# Patient Record
Sex: Male | Born: 1935 | Race: White | Hispanic: No | Marital: Married | Smoking: Former smoker
Health system: Southern US, Community
[De-identification: ages and names within clinical notes are randomized; demographics above are authoritative.]

## PROBLEM LIST (undated history)

## (undated) DIAGNOSIS — Z9181 History of falling: Secondary | ICD-10-CM

## (undated) DIAGNOSIS — E119 Type 2 diabetes mellitus without complications: Secondary | ICD-10-CM

## (undated) DIAGNOSIS — F32A Depression, unspecified: Secondary | ICD-10-CM

## (undated) DIAGNOSIS — F329 Major depressive disorder, single episode, unspecified: Secondary | ICD-10-CM

## (undated) DIAGNOSIS — I480 Paroxysmal atrial fibrillation: Secondary | ICD-10-CM

## (undated) DIAGNOSIS — E559 Vitamin D deficiency, unspecified: Secondary | ICD-10-CM

## (undated) DIAGNOSIS — K219 Gastro-esophageal reflux disease without esophagitis: Secondary | ICD-10-CM

## (undated) DIAGNOSIS — E43 Unspecified severe protein-calorie malnutrition: Secondary | ICD-10-CM

## (undated) DIAGNOSIS — N400 Enlarged prostate without lower urinary tract symptoms: Secondary | ICD-10-CM

## (undated) DIAGNOSIS — Z789 Other specified health status: Secondary | ICD-10-CM

## (undated) DIAGNOSIS — I5022 Chronic systolic (congestive) heart failure: Secondary | ICD-10-CM

## (undated) DIAGNOSIS — I350 Nonrheumatic aortic (valve) stenosis: Secondary | ICD-10-CM

## (undated) DIAGNOSIS — I2510 Atherosclerotic heart disease of native coronary artery without angina pectoris: Secondary | ICD-10-CM

## (undated) DIAGNOSIS — M1990 Unspecified osteoarthritis, unspecified site: Secondary | ICD-10-CM

## (undated) DIAGNOSIS — I10 Essential (primary) hypertension: Secondary | ICD-10-CM

## (undated) DIAGNOSIS — E1121 Type 2 diabetes mellitus with diabetic nephropathy: Secondary | ICD-10-CM

## (undated) DIAGNOSIS — Z9289 Personal history of other medical treatment: Secondary | ICD-10-CM

## (undated) DIAGNOSIS — D509 Iron deficiency anemia, unspecified: Secondary | ICD-10-CM

## (undated) HISTORY — DX: Unspecified severe protein-calorie malnutrition: E43

## (undated) HISTORY — PX: ADENOIDECTOMY, TONSILLECTOMY AND MYRINGOTOMY WITH TUBE PLACEMENT: SHX5716

## (undated) HISTORY — PX: COLONOSCOPY: SHX174

## (undated) HISTORY — PX: OTHER SURGICAL HISTORY: SHX169

---

## 2000-03-08 HISTORY — PX: CORONARY ARTERY BYPASS GRAFT: SHX141

## 2015-04-07 ENCOUNTER — Inpatient Hospital Stay (HOSPITAL_COMMUNITY)
Admission: AD | Admit: 2015-04-07 | Discharge: 2015-04-12 | DRG: 313 | Disposition: A | Discharge status: Home / Self Care | Payer: Medicare (Managed Care) | Source: Other Acute Inpatient Hospital | Attending: Cardiovascular Disease | Admitting: Cardiovascular Disease

## 2015-04-07 ENCOUNTER — Encounter (HOSPITAL_COMMUNITY): Payer: Self-pay | Admitting: Cardiology

## 2015-04-07 DIAGNOSIS — Z87891 Personal history of nicotine dependence: Secondary | ICD-10-CM

## 2015-04-07 DIAGNOSIS — K7460 Unspecified cirrhosis of liver: Secondary | ICD-10-CM | POA: Diagnosis present

## 2015-04-07 DIAGNOSIS — N400 Enlarged prostate without lower urinary tract symptoms: Secondary | ICD-10-CM | POA: Diagnosis present

## 2015-04-07 DIAGNOSIS — I420 Dilated cardiomyopathy: Secondary | ICD-10-CM | POA: Insufficient documentation

## 2015-04-07 DIAGNOSIS — I2510 Atherosclerotic heart disease of native coronary artery without angina pectoris: Secondary | ICD-10-CM | POA: Diagnosis present

## 2015-04-07 DIAGNOSIS — R748 Abnormal levels of other serum enzymes: Secondary | ICD-10-CM | POA: Diagnosis present

## 2015-04-07 DIAGNOSIS — E43 Unspecified severe protein-calorie malnutrition: Secondary | ICD-10-CM | POA: Diagnosis present

## 2015-04-07 DIAGNOSIS — R7989 Other specified abnormal findings of blood chemistry: Secondary | ICD-10-CM

## 2015-04-07 DIAGNOSIS — D509 Iron deficiency anemia, unspecified: Secondary | ICD-10-CM | POA: Diagnosis present

## 2015-04-07 DIAGNOSIS — K219 Gastro-esophageal reflux disease without esophagitis: Secondary | ICD-10-CM | POA: Diagnosis present

## 2015-04-07 DIAGNOSIS — Z7982 Long term (current) use of aspirin: Secondary | ICD-10-CM

## 2015-04-07 DIAGNOSIS — R0602 Shortness of breath: Secondary | ICD-10-CM | POA: Diagnosis present

## 2015-04-07 DIAGNOSIS — I350 Nonrheumatic aortic (valve) stenosis: Secondary | ICD-10-CM | POA: Diagnosis not present

## 2015-04-07 DIAGNOSIS — Z794 Long term (current) use of insulin: Secondary | ICD-10-CM

## 2015-04-07 DIAGNOSIS — I5022 Chronic systolic (congestive) heart failure: Secondary | ICD-10-CM | POA: Diagnosis present

## 2015-04-07 DIAGNOSIS — Z6820 Body mass index (BMI) 20.0-20.9, adult: Secondary | ICD-10-CM | POA: Diagnosis not present

## 2015-04-07 DIAGNOSIS — I10 Essential (primary) hypertension: Secondary | ICD-10-CM | POA: Diagnosis present

## 2015-04-07 DIAGNOSIS — Z9181 History of falling: Secondary | ICD-10-CM | POA: Diagnosis not present

## 2015-04-07 DIAGNOSIS — R0789 Other chest pain: Secondary | ICD-10-CM | POA: Diagnosis present

## 2015-04-07 DIAGNOSIS — I25119 Atherosclerotic heart disease of native coronary artery with unspecified angina pectoris: Secondary | ICD-10-CM

## 2015-04-07 DIAGNOSIS — Z951 Presence of aortocoronary bypass graft: Secondary | ICD-10-CM | POA: Diagnosis not present

## 2015-04-07 DIAGNOSIS — Z880 Allergy status to penicillin: Secondary | ICD-10-CM

## 2015-04-07 DIAGNOSIS — R079 Chest pain, unspecified: Secondary | ICD-10-CM | POA: Diagnosis not present

## 2015-04-07 DIAGNOSIS — E1121 Type 2 diabetes mellitus with diabetic nephropathy: Secondary | ICD-10-CM | POA: Diagnosis present

## 2015-04-07 DIAGNOSIS — D649 Anemia, unspecified: Secondary | ICD-10-CM | POA: Diagnosis present

## 2015-04-07 DIAGNOSIS — I447 Left bundle-branch block, unspecified: Secondary | ICD-10-CM | POA: Diagnosis present

## 2015-04-07 DIAGNOSIS — I480 Paroxysmal atrial fibrillation: Secondary | ICD-10-CM | POA: Diagnosis present

## 2015-04-07 DIAGNOSIS — R778 Other specified abnormalities of plasma proteins: Secondary | ICD-10-CM

## 2015-04-07 DIAGNOSIS — E785 Hyperlipidemia, unspecified: Secondary | ICD-10-CM | POA: Diagnosis present

## 2015-04-07 DIAGNOSIS — E119 Type 2 diabetes mellitus without complications: Secondary | ICD-10-CM

## 2015-04-07 HISTORY — DX: Major depressive disorder, single episode, unspecified: F32.9

## 2015-04-07 HISTORY — DX: Gastro-esophageal reflux disease without esophagitis: K21.9

## 2015-04-07 HISTORY — DX: Essential (primary) hypertension: I10

## 2015-04-07 HISTORY — DX: Paroxysmal atrial fibrillation: I48.0

## 2015-04-07 HISTORY — DX: Type 2 diabetes mellitus with diabetic nephropathy: E11.21

## 2015-04-07 HISTORY — DX: Iron deficiency anemia, unspecified: D50.9

## 2015-04-07 HISTORY — DX: History of falling: Z91.81

## 2015-04-07 HISTORY — DX: Type 2 diabetes mellitus without complications: E11.9

## 2015-04-07 HISTORY — DX: Atherosclerotic heart disease of native coronary artery without angina pectoris: I25.10

## 2015-04-07 HISTORY — DX: Vitamin D deficiency, unspecified: E55.9

## 2015-04-07 HISTORY — DX: Nonrheumatic aortic (valve) stenosis: I35.0

## 2015-04-07 HISTORY — DX: Benign prostatic hyperplasia without lower urinary tract symptoms: N40.0

## 2015-04-07 HISTORY — DX: Other specified health status: Z78.9

## 2015-04-07 HISTORY — DX: Depression, unspecified: F32.A

## 2015-04-07 HISTORY — DX: Chronic systolic (congestive) heart failure: I50.22

## 2015-04-07 LAB — TROPONIN I: Troponin I: 0.06 ng/mL — ABNORMAL HIGH (ref <0.031)

## 2015-04-07 LAB — GLUCOSE, CAPILLARY: GLUCOSE-CAPILLARY: 74 mg/dL (ref 65–99)

## 2015-04-07 LAB — HEPARIN LEVEL (UNFRACTIONATED): HEPARIN UNFRACTIONATED: 0.72 [IU]/mL — AB (ref 0.30–0.70)

## 2015-04-07 MED ORDER — HEPARIN (PORCINE) IN NACL 100-0.45 UNIT/ML-% IJ SOLN
1000.0000 [IU]/h | INTRAMUSCULAR | Status: DC
  Administered 2015-04-07: 1200 [IU]/h via INTRAVENOUS
  Administered 2015-04-08: 1000 [IU]/h via INTRAVENOUS
  Administered 2015-04-10 – 2015-04-11 (×2): 900 [IU]/h via INTRAVENOUS
  Administered 2015-04-12: 1000 [IU]/h via INTRAVENOUS
  Filled 2015-04-07 (×4): qty 250

## 2015-04-07 MED ORDER — INSULIN ASPART 100 UNIT/ML ~~LOC~~ SOLN
0.0000 [IU] | Freq: Three times a day (TID) | SUBCUTANEOUS | Status: DC
  Administered 2015-04-08: 8 [IU] via SUBCUTANEOUS
  Administered 2015-04-08 – 2015-04-09 (×2): 2 [IU] via SUBCUTANEOUS
  Administered 2015-04-09: 5 [IU] via SUBCUTANEOUS
  Administered 2015-04-10: 3 [IU] via SUBCUTANEOUS
  Administered 2015-04-10: 11 [IU] via SUBCUTANEOUS
  Administered 2015-04-10: 3 [IU] via SUBCUTANEOUS
  Administered 2015-04-11: 8 [IU] via SUBCUTANEOUS
  Administered 2015-04-11: 3 [IU] via SUBCUTANEOUS
  Administered 2015-04-12: 5 [IU] via SUBCUTANEOUS
  Administered 2015-04-12: 3 [IU] via SUBCUTANEOUS
  Administered 2015-04-12: 2 [IU] via SUBCUTANEOUS

## 2015-04-07 MED ORDER — ASPIRIN EC 81 MG PO TBEC
81.0000 mg | DELAYED_RELEASE_TABLET | Freq: Every day | ORAL | Status: DC
  Administered 2015-04-08 – 2015-04-12 (×5): 81 mg via ORAL
  Filled 2015-04-07 (×5): qty 1

## 2015-04-07 MED ORDER — ONDANSETRON HCL 4 MG/2ML IJ SOLN: 4.0000 mg | Freq: Four times a day (QID) | INTRAMUSCULAR | Status: DC | PRN

## 2015-04-07 MED ORDER — ATORVASTATIN CALCIUM 40 MG PO TABS
40.0000 mg | ORAL_TABLET | Freq: Every day | ORAL | Status: DC
  Administered 2015-04-07 – 2015-04-12 (×6): 40 mg via ORAL
  Filled 2015-04-07 (×6): qty 1

## 2015-04-07 MED ORDER — SOTALOL HCL 120 MG PO TABS
120.0000 mg | ORAL_TABLET | Freq: Every day | ORAL | Status: DC
  Administered 2015-04-08 – 2015-04-12 (×5): 120 mg via ORAL
  Filled 2015-04-07 (×5): qty 1

## 2015-04-07 MED ORDER — HYDRALAZINE HCL 20 MG/ML IJ SOLN
5.0000 mg | Freq: Three times a day (TID) | INTRAMUSCULAR | Status: DC | PRN
  Administered 2015-04-07: 5 mg via INTRAVENOUS
  Filled 2015-04-07: qty 1

## 2015-04-07 MED ORDER — DULOXETINE HCL 60 MG PO CPEP
60.0000 mg | ORAL_CAPSULE | Freq: Every day | ORAL | Status: DC
  Administered 2015-04-07 – 2015-04-12 (×6): 60 mg via ORAL
  Filled 2015-04-07 (×6): qty 1

## 2015-04-07 MED ORDER — FUROSEMIDE 20 MG PO TABS
20.0000 mg | ORAL_TABLET | Freq: Every day | ORAL | Status: DC
  Administered 2015-04-08 – 2015-04-12 (×5): 20 mg via ORAL
  Filled 2015-04-07 (×5): qty 1

## 2015-04-07 MED ORDER — PANTOPRAZOLE SODIUM 40 MG PO TBEC
40.0000 mg | DELAYED_RELEASE_TABLET | Freq: Every day | ORAL | Status: DC
  Administered 2015-04-08 – 2015-04-12 (×5): 40 mg via ORAL
  Filled 2015-04-07 (×5): qty 1

## 2015-04-07 MED ORDER — ACETAMINOPHEN 325 MG PO TABS: 650.0000 mg | ORAL_TABLET | ORAL | Status: DC | PRN

## 2015-04-07 MED ORDER — NITROGLYCERIN 0.4 MG SL SUBL
0.4000 mg | SUBLINGUAL_TABLET | SUBLINGUAL | Status: DC | PRN
  Administered 2015-04-08: 0.4 mg via SUBLINGUAL
  Filled 2015-04-07: qty 1

## 2015-04-07 NOTE — Progress Notes (Signed)
Pt arrived from [facility] at 1950.  EMS stated they had repeated EKG and there were ST elevations.  Patient in no acute distress.  Denies CP currently and en route.  Wife at bedside.  VSS.  Cardiology PA and on Call MD at bedside.  IV Heparin infusing at 63ml/hr.

## 2015-04-07 NOTE — Progress Notes (Addendum)
ANTICOAGULATION CONSULT NOTE - Initial Consult  Pharmacy Consult for heparin Indication: chest pain/ACS  No Known Allergies  Patient Measurements: Height:  (185.4 cm) Weight: 149 lb 6.4 oz (67.767 kg) IBW/kg (Calculated) : 79.9 Heparin Dosing Weight: 67.7 kg  Vital Signs: BP: 173/108 mmHg (01/30 2021) Pulse Rate: 90 (01/30 2021)  Labs: No results for input(s): HGB, HCT, PLT, APTT, LABPROT, INR, HEPARINUNFRC, HEPRLOWMOCWT, CREATININE, CKTOTAL, CKMB, TROPONINI in the last 72 hours.  CrCl cannot be calculated (Patient has no serum creatinine result on file.).   Medical History: Past Medical History  Diagnosis Date  . CAD (coronary artery disease)     Multivessel status post CABG 2002  . Essential hypertension   . Depression   . Type 2 diabetes mellitus (HCC)   . PAF (paroxysmal atrial fibrillation) (HCC)     On Sotalol (previously Eliquis - stopped 02/2015)  . Aortic stenosis     Moderate to severe May 2016  . Vitamin D deficiency   . Diabetic nephropathy (HCC)   . BPH (benign prostatic hyperplasia)   . GERD (gastroesophageal reflux disease)   . Chronic systolic heart failure (HCC)     LVEF 45-50% May 2016  . Medication intolerance     Amiodarone - nausea and emesis  . History of fall     Right wrist fracture and facial contusion 02/2015, possibly associated with orthostasis  . Iron deficiency anemia     Assessment: 81 YOM transferred from [facility] for chest pain. IV heparin 1200 units/hr was started prior to transfer at around 1600. He also received 4100 units bolus. Hgb 13.1, plt 232K, he has hx of afib, but not on anticoagulation prior to this admission, INR =1. Current heparin rate seems a little high. Will check a level now and adjust.   Goal of Therapy:  Heparin level 0.3-0.7 units/ml Monitor platelets by anticoagulation protocol: Yes   Plan:  - Continue heparin infusion 1200 units/hr for now.  - Check heparin level now - Daily heparin level  and CBC   Bayard Hugger, PharmD, BCPS  Clinical Pharmacist  Pager: [phone]   04/07/2015,9:01 PM    11:09 PM - HL = 0.72, will decrease to 1100 units / hr and follow up in AM Thank you Okey Regal, PharmD [phone]

## 2015-04-07 NOTE — H&P (Signed)
Patient ID: Gary Beck MRN: [id number], DOB/AGE: 1929-02-21   Admit date: 04/07/2015   Primary Physician: [name], MD Primary Cardiologist: Dr. Marisue Brooklyn - [facility]   NWG:[id number] Gary Beck is a 82 y.o. male with past medical history of CAD (s/p 3-vessel CABG in 2002), HTN, paroxysmal atrial fibrillation (not on anticoagulation since 02/2015), HLD, chronic systolic CHF (EF 08-65% by echo in May 2016), and moderate to severe aortic stenosis who presents to [facility] on 04/07/2015 as a transfer from [facility] for chest pain.   The patient reports his chest pain began earlier this afternoon following a trip to see his PCP in [place], [place]. He describes the pain as an "electrial shooting sensation" in his left pectoral region lasting several minutes. He denies any associated radiating pain, dyspnea on exertion, nausea, vomiting, or diaphoresis. He is currently without chest pain.  The patient was seen at [facility] for his chest pain. His initial EKG showed NSR, HR 64, old LBBB and left axis deviation. Troponin was slightly elevated at 0.036. INR 1.00. Creatinine stable at 0.90. K+ 3.7.WBC 7.2. Hgb 13.1. Platelets at 232. He was started on IV Heparin while at [facility] and transferred to [facility] for further evaluation.   He remains without chest pain at this time. His wife reports he had a recent cardiac catheterization in 02/2015 as work-up for possible aortic valve replacement. She says "all of his grafts were patent and everything looked well then". He has been on Eliquis for anticoagulation but they are unsure as to why this was stopped except that he has been off since the time of the cardiac cathetertization. In reviewing records, he had a fall in December and his Hgb was approximately 6.0 at that time, but he was already off Eliquis at that time. He denies any active bleeding at this time or previously when he was severely anemic. He had also been on Amiodarone in the  past for his atrial fibrillation but was unable to tolerate the medication due to severe nausea, vomiting, and weight loss. He was restarted on Sotalol in December.   Home Medications:  Prior to Admission medications   Not on File    Allergies No Known Allergies  Past Medical History Past Medical History  Diagnosis Date  . CAD (coronary artery disease)     Multivessel status post CABG 2002  . Essential hypertension   . Depression   . Type 2 diabetes mellitus (HCC)   . PAF (paroxysmal atrial fibrillation) (HCC)     On Sotalol (previously Eliquis - stopped 02/2015)  . Aortic stenosis     Moderate to severe May 2016  . Vitamin D deficiency   . Diabetic nephropathy (HCC)   . BPH (benign prostatic hyperplasia)   . GERD (gastroesophageal reflux disease)   . Chronic systolic heart failure (HCC)     LVEF 45-50% May 2016  . Medication intolerance     Amiodarone - nausea and emesis  . History of fall     Right wrist fracture and facial contusion 02/2015, possibly associated with orthostasis  . Iron deficiency anemia      Surgical History   Past Surgical History  Procedure Laterality Date  . Cataract surgery Bilateral   . Coronary artery bypass graft  2002     [facility]  . Adenoidectomy, tonsillectomy and myringotomy with tube placement       Family History  Family History  Problem Relation Age of Onset  . Heart  attack Father   . Hypertension Father   . Diabetes Mellitus II Brother     Social History  Social History   Social History  . Marital Status: Married    Spouse Name: N/A  . Number of Children: N/A  . Years of Education: N/A   Occupational History  . Not on file.   Social History Main Topics  . Smoking status: Former Smoker    Types: Cigarettes    Quit date: 03/09/1959  . Smokeless tobacco: Not on file  . Alcohol Use: No  . Drug Use: No  . Sexual Activity: Not on file   Other Topics Concern  . Not on file   Social History  Narrative     Review of Systems General:  No chills, fever, night sweats or weight changes.  Cardiovascular:  No  dyspnea on exertion, edema, orthopnea, palpitations, paroxysmal nocturnal dyspnea. Positive for chest pain. Dermatological: No rash, lesions/masses Respiratory: No cough, dyspnea Urologic: No hematuria, dysuria Abdominal:   No nausea, vomiting, diarrhea, bright red blood per rectum, melena, or hematemesis Neurologic:  No visual changes, wkns, changes in mental status. All other systems reviewed and are otherwise negative except as noted above.   Physical Exam: Blood pressure 173/108, pulse 90, resp. rate 14, height 6\' 1"  (1.854 m), weight 149 lb 6.4 oz (67.767 kg), SpO2 98 %.   Gen: Elderly male in no distress, no active chest pain. HEENT: Conjunctiva and lids normal, oropharynx clear. Neck: Supple, no elevated JVP or carotid bruits, no thyromegaly. Lungs: Clear to auscultation, nonlabored breathing at rest. Cardiac: Regular rate and rhythm, no S3, 3/6 systolic murmur at base, no pericardial rub. Abdomen: Soft, nontender, bowel sounds present, no guarding or rebound. Extremities: No pitting edema, distal pulses 1-2+. Skin: Warm and dry. Dressing on right knee. Musculoskeletal: Mild kyphosis. Neuropsychiatric: Alert and oriented x3, affect grossly appropriate.   Labs: Pending    ECG:  NSR, HR 64, old LBBB and left axis deviation  Echo May 2016: Clinical Diagnoses and Echocardiographic Findings Left ventricular hypertrophy Segmental contractile left ventricular dysfunction (see detail below) Decreased left ventricular ejection fraction (45-50%) Diastolic left ventricular dysfunction Elevated left ventricular filling pressures Mitral annular calcification Mitral regurgitation (mild to moderate) Dilated left atrium Aortic regurgitation (mild) Aortic stenosis (moderate to severe - see detail below) Pulmonary hypertension (moderate - see detail below) Pulmonary  regurgitation (trivial, probably physiologic) Normal right ventricular contractile performance Tricuspid regurgitation (severe) Elevated central venous and right atrial pressures (see detail below) Dilated right atrium Descriptive Comments - Left Ventricle The left ventricle is normal in size with mildly increased wall thickness and mildly decreased contraction. There is septal dyskinesis consistent with bundle branch block. The derived left ventricular ejection fraction is 45-50%. Diastolic transmitral flow profile and mitral annular tissue Doppler signal characteristic of decreased left ventricular chamber compliance with elevated filling pressures. Mitral Valve There is annular calcification and the mitral leaflets are mildly thickened with normal mobility. There is mild to moderate mitral regurgitation by color flow and continuous wave Doppler examinations. Left Atrium The left atrium is moderately dilated. Aortic Valve The aortic valve is trileaflet and thickened with severely limited excursion. There is mild aortic regurgitation by color flow and continuous wave Doppler examinations. The LVOT/AoV maximal velocity and TVI ratios are 0.25 and 0.25 respectively with peak instantaneous transvalvular jet velocity of approximately 3.6 m/s. The estimated peak instantaneous aortic valvular pressure gradient is 52 mm Hg. The estimated mean aortic valvular pressure gradient is 28 mm Hg.  The estimated aortic valve area is 1.0 sq cm by the continuity equation calculation. Aorta The thoracic aorta is normal in diameter at the level of the left ventricular outflow tract, sinuses of Valsalva, sinotubular junction and transverse arch. Pulmonary Artery The pulmonary artery is normal in diameter. Pulmonic Valve The pulmonary valve is normal. There is trivial (probably physiologic) pulmonary regurgitation by color flow and continuous wave Doppler imaging. There is no evidence of  hemodynamically important pulmonary transvalvular gradient; the maximal instantaneous right ventricular outflow velocity is approximately 1.3 m/s. Right Ventricle There is normal right ventricular chamber size and contraction. Tricuspid Valve The tricuspid valve is structurally normal. There is severe tricuspid regurgitation by color flow and continuous wave Doppler imaging. The peak instantaneous tricuspid regurgitant jet velocity is 3.0 m/s characteristic of moderately elevated right ventricular systolic pressure (45-50 mm Hg). Right Atrium The right atrium is mildly dilated. Inferior Vena Cava The inferior vena cava is dilated with physiological phasic respiratory change characteristic of mildly elevated central venous and right atrial pressures; the estimated central venous and right atrial pressures are 10-15 mm Hg. Pericardium There is no evidence of pericardial effusion.   ASSESSMENT AND PLAN:   1. Atypical chest pain - presents with episode of a "shooting electrical pain" along his left pectoral region. Occurred at rest. Not similar to his previous cardiac events. Currently without chest pain. In  - has a history of CAD, s/p 3-vessel bypass in 2002. Patient reports having a cath in [facility] 02/2015 which showed patent grafts. - continue ASA, statin, and BB. Started on Heparin at outside facility. Will continue with Heparin until serial cardiac enzymes result. PRN SL NTG. - will obtain echocardiogram tomorrow morning.  2. Elevated troponin - initial POC troponin was 0.036 at outside hospital. - will cycle cardiac enzymes.  3. Paroxysmal Atrial Fibrillation - currently in NSR - This patients CHA2DS2-VASc Score and unadjusted Ischemic Stroke Rate (% per year) is equal to 9.7 % stroke rate/year from a score of 6 (CHF, HTN, DM, Vascular, Age (2)). Was on Eliquis in 02/2015. This was discontinued due to anemia and a fall. - continue Sotalol for rate control  4. HLD -  continue statin  5. Chronic Systolic CHF - EF 45-50% by echo in 07/2014. - will obtain repeat echo in AM - continue PTA PO Lasix  daily.  6. Moderate to Severe Aortic Stenosis - currently undergoing workup for AVR by his primary Cardiologist. - repeat echo is pending  Signed, Ellsworth Lennox, PA-C 04/07/2015, 8:59 PM Pager: [phone]   Attending note:  Patient seen and examined. Reviewed extensive records and personally updated his chart to reflect summarization of findings. Case discussed with Ms. Iran Ouch PA-C, and I agree with her above assessment. Mr. Antillon was accepted in transfer by Dr. Tresa Endo from [facility] for further evaluation of atypical chest pain and minimally abnormal troponin I level based on review of the chart. His regular cardiologist is Dr. Marisue Brooklyn. History includes multivessel CAD status post CABG in 2002, reportedly with patent bypass grafts by recent cardiac catheterization done in [place] back in December 2016. It looks like this procedure was undertaken as part of a workup for aortic stenosis which looks to be in the moderate to severe range, and being followed at this time. He also has documented PAF diagnosed within the last 6 months, at this point is on sotalol having not tolerated amiodarone due to nausea and emesis. He was at one point on Eliquis, but this was  stopped in December 2016 around the time of his cardiac catheterization, and he has not been reinstituted on any anticoagulation as yet. Further complicated matters is a fall that he had in December 2016 resulting in right wrist fracture and a facial contusion as well as severe anemia. This may have also factored into the decision regarding reinstitution of anticoagulation but it is not entirely clear.  He states that earlier today he began experiencing a shooting, almost electric-like sensation in his chest, seems to indicate that it was in association with his heartbeat, but does not  specifically report any rapid palpitations, had no shortness of breath, no dizziness or syncope. When seen in the [facility] ER he states that his symptoms had actually already resolved. His ECG shows an incomplete left bundle branch block which is reportedly old and he does look to be in sinus rhythm at least by one tracing showing an indistinct P wave with prolonged PR interval, the other tracing looks more like a junctional rhythm. His troponin I level was minimally increased at 0.036.  His wife states that the patient's sotalol dose was just increased to 120 mg twice daily within the last week, otherwise no major medication changes. As noted above, he was taken off of Eliquis in December 2016, and it sounds like he has also had other medications removed from his list due to orthostasis.  On my examination he appeared comfortable, blood pressure significantly elevated to 173/108, heart rate in the 80s. Elderly male in no distress, lungs are clear without labored breathing, cardiac exam revealed regular rate and rhythm with 3/6 basal systolic murmur, no diastolic murmur or gallop. Extremities showed no pitting edema. Dressing noted on the right knee. Lab work pending at this facility, although summarized above based on lab work from [facility]. His most recent hemoglobin was 13.1 and his creatinine is 1.0, although I do see that he has a history of renal insufficiency.  Patient transferred with fairly atypical chest pain and equivocal troponin I level. His ECG shows a probable old incomplete left bundle-branch block. He has history of PAF although recent tracings show probable sinus rhythm with prolonged PR interval and at one point a junctional rhythm. He also has moderate to severe aortic stenosis and reportedly a recent cardiac catheterization showing patent bypass grafts done in [place]. Plan at this time is to cycle cardiac markers, obtain records regarding his recent cardiac catheterization in  [place], follow-up with an echocardiogram to reassess cardiac structure and function as well as degree of aortic stenosis, and check orthostatic vital signs. We will use PRN medications to manage blood pressure until trend is better understood. May need to consider reinstituting some of his former medications. Not entirely clear how to handle the issue of anticoagulation, but he does have a high thromboembolic risk score and this will need to be discussed. I do not have absolute details about why his anticoagulation was not resumed, but it could have been due to his fall with severe anemia. In any event, a DOAC would probably not be the best choice in his case with moderate to severe aortic stenosis. Ischemic testing is not being pursued as yet until more information is available. Our cardiology service will continue to manage him with further recommendations in the morning.  Jonelle Sidle, M.D., F.A.C.C.

## 2015-04-08 ENCOUNTER — Encounter (HOSPITAL_COMMUNITY): Payer: Self-pay

## 2015-04-08 DIAGNOSIS — I2510 Atherosclerotic heart disease of native coronary artery without angina pectoris: Secondary | ICD-10-CM

## 2015-04-08 LAB — TROPONIN I
Troponin I: 0.06 ng/mL — ABNORMAL HIGH (ref <0.031)
Troponin I: 0.05 ng/mL — ABNORMAL HIGH (ref <0.031)

## 2015-04-08 LAB — CBC
HEMATOCRIT: 40 % (ref 39.0–52.0)
Hemoglobin: 13.2 g/dL (ref 13.0–17.0)
MCH: 29.8 pg (ref 26.0–34.0)
MCHC: 33 g/dL (ref 30.0–36.0)
MCV: 90.3 fL (ref 78.0–100.0)
Platelets: 235 10*3/uL (ref 150–400)
RBC: 4.43 MIL/uL (ref 4.22–5.81)
RDW: 14.3 % (ref 11.5–15.5)
WBC: 5.9 10*3/uL (ref 4.0–10.5)

## 2015-04-08 LAB — LIPID PANEL
CHOL/HDL RATIO: 3.1 ratio
CHOLESTEROL: 142 mg/dL (ref 0–200)
HDL: 46 mg/dL (ref >40)
LDL Cholesterol: 82 mg/dL (ref 0–99)
Triglycerides: 68 mg/dL (ref <150)
VLDL: 14 mg/dL (ref 0–40)

## 2015-04-08 LAB — BASIC METABOLIC PANEL
Anion gap: 12 (ref 5–15)
BUN: 10 mg/dL (ref 6–20)
CHLORIDE: 100 mmol/L — AB (ref 101–111)
CO2: 29 mmol/L (ref 22–32)
Calcium: 8 mg/dL — ABNORMAL LOW (ref 8.9–10.3)
Creatinine, Ser: 0.78 mg/dL (ref 0.61–1.24)
GFR calc Af Amer: >60 mL/min (ref >60)
GFR calc non Af Amer: >60 mL/min (ref >60)
GLUCOSE: 107 mg/dL — AB (ref 65–99)
POTASSIUM: 3.1 mmol/L — AB (ref 3.5–5.1)
Sodium: 141 mmol/L (ref 135–145)

## 2015-04-08 LAB — HEPARIN LEVEL (UNFRACTIONATED)
HEPARIN UNFRACTIONATED: 0.9 [IU]/mL — AB (ref 0.30–0.70)
HEPARIN UNFRACTIONATED: 0.7 [IU]/mL (ref 0.30–0.70)

## 2015-04-08 LAB — GLUCOSE, CAPILLARY
GLUCOSE-CAPILLARY: 83 mg/dL (ref 65–99)
GLUCOSE-CAPILLARY: 40 mg/dL — AB (ref 65–99)
GLUCOSE-CAPILLARY: 105 mg/dL — AB (ref 65–99)
Glucose-Capillary: 126 mg/dL — ABNORMAL HIGH (ref 65–99)
Glucose-Capillary: 273 mg/dL — ABNORMAL HIGH (ref 65–99)

## 2015-04-08 LAB — PROTIME-INR
INR: 1.09 (ref 0.00–1.49)
Prothrombin Time: 14.3 seconds (ref 11.6–15.2)

## 2015-04-08 MED ORDER — ISOSORBIDE MONONITRATE ER 30 MG PO TB24
30.0000 mg | ORAL_TABLET | Freq: Every day | ORAL | Status: DC
  Administered 2015-04-08 – 2015-04-12 (×5): 30 mg via ORAL
  Filled 2015-04-08 (×5): qty 1

## 2015-04-08 MED ORDER — POTASSIUM CHLORIDE CRYS ER 20 MEQ PO TBCR
40.0000 meq | EXTENDED_RELEASE_TABLET | Freq: Two times a day (BID) | ORAL | Status: AC
  Administered 2015-04-08 (×2): 40 meq via ORAL
  Filled 2015-04-08 (×2): qty 2

## 2015-04-08 MED ORDER — ENSURE ENLIVE PO LIQD
237.0000 mL | Freq: Two times a day (BID) | ORAL | Status: DC
  Administered 2015-04-08 – 2015-04-12 (×6): 237 mL via ORAL

## 2015-04-08 MED ORDER — CETYLPYRIDINIUM CHLORIDE 0.05 % MT LIQD
7.0000 mL | Freq: Two times a day (BID) | OROMUCOSAL | Status: DC
  Administered 2015-04-08 – 2015-04-12 (×9): 7 mL via OROMUCOSAL

## 2015-04-08 MED ORDER — GLUCERNA SHAKE PO LIQD
237.0000 mL | Freq: Three times a day (TID) | ORAL | Status: DC
  Administered 2015-04-08 – 2015-04-12 (×10): 237 mL via ORAL

## 2015-04-08 MED ORDER — AMLODIPINE BESYLATE 5 MG PO TABS
5.0000 mg | ORAL_TABLET | Freq: Every day | ORAL | Status: DC
  Administered 2015-04-08 – 2015-04-12 (×5): 5 mg via ORAL
  Filled 2015-04-08 (×5): qty 1

## 2015-04-08 NOTE — Care Management Note (Addendum)
Case Management Note  Patient Details  Name: Gary Beck MRN: [id number] Date of Birth: 1928-11-16  Subjective/Objective:   Pt admitted for Chest Pain- transfer pt from [facility]. Pt is from home with wife. Per pt he had a recent fall on Dec 15th and was supposed to have [facility] and the agency never showed. Pt is now scheduled to have outpatient therapy at the [facility] on the 7th of Feb. Wife to provide transportation. Pt has DME RW.                  Action/Plan: No further needs from CM at this time.    Expected Discharge Date:                  Expected Discharge Plan:  Home/Self Care  In-House Referral:  NA  Discharge planning Services  CM Consult  Post Acute Care Choice:  NA Choice offered to:  NA  DME Arranged:  N/A DME Agency:  NA  HH Arranged:  NA HH Agency:  NA  Status of Service:  Completed, signed off  Medicare Important Message Given:    Date Medicare IM Given:    Medicare IM give by:    Date Additional Medicare IM Given:    Additional Medicare Important Message give by:     If discussed at Long Length of Stay Meetings, dates discussed:    Additional Comments: 1049 04-12-15 Tomi Bamberger, RN,BSN [phone] CM did speak with pt in regards to disposition needs. Plan was for home with Outpatient PT Services and PCP was to set up. CM did touch base with wife- plan will be for TAVR on Tuesday and CM stated we will f/u post op for home needs. No further needs from CM at this time.    Gala Lewandowsky, RN 04/08/2015, 3:26 PM

## 2015-04-08 NOTE — Progress Notes (Signed)
Initial Nutrition Assessment  DOCUMENTATION CODES:   Severe malnutrition in context of chronic illness  INTERVENTION:    Glucerna Shake PO TID, each supplement provides 220 kcal and 10 grams of protein  NUTRITION DIAGNOSIS:   Malnutrition related to chronic illness as evidenced by energy intake < or equal to 75% for > or equal to 1 month, severe depletion of muscle mass.  GOAL:   Patient will meet greater than or equal to 90% of their needs  MONITOR:   PO intake, Supplement acceptance, Labs, Weight trends  REASON FOR ASSESSMENT:   Malnutrition Screening Tool    ASSESSMENT:   82 y.o. male with past medical history of CAD (s/p 3-vessel CABG in 2002), HTN, paroxysmal atrial fibrillation (not on anticoagulation since 02/2015), HLD, chronic systolic CHF (EF 16-10% by echo in May 2016), and moderate to severe aortic stenosis who presents to [facility] on 04/07/2015 as a transfer from [facility] for chest pain.   Labs reviewed: potassium is low.  Patient and his wife report that he has been eating poorly for the past 1-2 months since starting on a new medication that caused him nausea and vomiting. The medication has since been changed. His usual weight is ~150-160 lbs. He usually eats a well-balanced diet, wife watches his sodium, fat, and CHO intake. Nutrition-Focused physical exam completed. Findings are mild-moderate fat depletion, mild-moderate and severe muscle depletion, and no edema. Suspect intake has been < 75% of estimated energy requirement for the past month or so. Patient with severe PCM. He drinks Glucerna Shakes once or twice per day at home.  Diet Order:  Diet heart healthy/carb modified Room service appropriate?: Yes; Fluid consistency:: Thin  Skin:  Reviewed, no issues  Last BM:  1/29  Height:   Ht Readings from Last 1 Encounters:  04/07/15  (1.854 m)    Weight:   Wt Readings from Last 1 Encounters:  04/08/15 151 lb 9.6 oz (68.765 kg)     Ideal Body Weight:  83.6 kg  BMI:  Body mass index is 20.01 kg/(m^2).  Estimated Nutritional Needs:   Kcal:  2100-2300  Protein:  100-115 gm  Fluid:  2.1-2.3 L  EDUCATION NEEDS:   Education needs addressed  [name], RD, LDN, CNSC Pager [phone] After Hours Pager [phone]

## 2015-04-08 NOTE — Progress Notes (Signed)
Pt c/o CP 3/10, states it is "like a pressure on my chest, but I'm not sure if it's because of the way I'm laying in the bed".  After a few minutes states, "I think it is chest pain from my heart".    2L oxygen applied with no improvement.  1 SL ntg given.  BP=117/55 after giving Ntg.  Pt states CP has almost completely subsided.  EKG also obtained and Cards Fellow Sherren Kerns) notified.    Will continue to monitor closely.

## 2015-04-08 NOTE — Progress Notes (Signed)
Patient Name: Gary Beck Date of Encounter: 04/08/2015   Primary Physician: [name], MD Primary Cardiologist: Dr. Marisue Brooklyn - [facility]   Pt. profile:Gary Beck is a 82 y.o. male with past medical history of CAD (s/p 3-vessel CABG in 2002), HTN, paroxysmal atrial fibrillation (not on anticoagulation since 02/2015), HLD, chronic systolic CHF (EF 82-95% by echo in May 2016), and moderate to severe aortic stenosis who presents to [facility] on 04/07/2015 as a transfer from [facility] for chest pain.  (see H&P for recent evaluation of AS and fall hx)  SUBJECTIVE  One episode of chest pain overnight, resolved with SL nitro x 1. No further episode. No sob or  Palpitations.   CURRENT MEDS . antiseptic oral rinse  7 mL Mouth Rinse BID  . aspirin EC  81 mg Oral Daily  . atorvastatin  40 mg Oral q1800  . DULoxetine  60 mg Oral Daily  . feeding supplement (ENSURE ENLIVE)  237 mL Oral BID BM  . furosemide  20 mg Oral Daily  . insulin aspart  0-15 Units Subcutaneous TID WC  . pantoprazole  40 mg Oral Daily  . sotalol  120 mg Oral Daily    OBJECTIVE  Filed Vitals:   04/08/15 0045 04/08/15 0420 04/08/15 0500 04/08/15 0751  BP: 117/59 142/76  142/78  Pulse: 66 61  66  Temp:  98.4 F (36.9 C)  98.5 F (36.9 C)  TempSrc:  Oral  Oral  Resp: Height:      Weight:   151 lb 9.6 oz (68.765 kg)   SpO2: 96% 96%  97%    Intake/Output Summary (Last 24 hours) at 04/08/15 0955 Last data filed at 04/08/15 0650  Gross per 24 hour  Intake  94.85 ml  Output    600 ml  Net -505.15 ml   Filed Weights   04/07/15 2044 04/08/15 0500  Weight: 149 lb 6.4 oz (67.767 kg) 151 lb 9.6 oz (68.765 kg)    PHYSICAL EXAM  General: Pleasant, elderly male in NAD. Neuro: Alert and oriented X 3. Moves all extremities spontaneously. Psych: Normal affect. HEENT:  Normal  Neck: Supple without bruits or JVD. Lungs:  Resp regular and unlabored, CTA. Heart: RRR no s3, s4/ 3/6  systolic murmurs. Abdomen: Soft, non-tender, non-distended, BS + x 4.  Extremities: No clubbing, cyanosis or edema. DP/PT/Radials 2+ and equal bilaterally.  Accessory Clinical Findings  CBC  Recent Labs  04/08/15 0941  WBC 5.9  HGB 13.2  HCT 40.0  MCV 90.3  PLT 235   Cardiac Enzymes  Recent Labs  04/07/15 2138 04/08/15 0231  TROPONINI 0.06* 0.06*   Fasting Lipid Panel  Recent Labs  04/08/15 0231  CHOL 142  HDL 46  LDLCALC 82  TRIG 68  CHOLHDL 3.1    TELE  Sinus rhythm with PACS/ junctional rhythm vs afib  (will review with MD)  Radiology/Studies  No results found.  ASSESSMENT AND PLAN   1. Atypical chest pain/Elevated troponin [facility] for further evaluation of atypical chest pain and minimally abnormal troponin I (0.036). On IV heparin.  - Troponin 0.06-->0.06.  - ECG shows an incomplete left bundle branch block which is reportedly old and he does look to be in sinus rhythm at least by one tracing showing an indistinct P wave with prolonged PR interval, the other tracing looks more like a junctional rhythm. - Currently chest pain free.   2. CAD - CAD, s/p  3-vessel bypass in 2002.  - Patient reports having a cath in [place] 02/2015 which showed patent grafts for evaluation of AS.  Requested records.   3. Paroxysmal Atrial Fibrillation - currently sinus vs afib (will review with MD) - This patients CHA2DS2-VASc Score and unadjusted Ischemic Stroke Rate (% per year) is equal to 9.7 % stroke rate/year from a score of 6 (CHF, HTN, DM, Vascular, Age (2). Was on Eliquis in 02/2015. This was discontinued due to anemia and a fall. Hgb has been stable.  - Continue Sotalol for rate control  4. HLD - continue statin  5. Chronic Systolic CHF - EF 45-50% by echo in 07/2014. - will obtain repeat echo in AM - continue PTA PO Lasix  daily.  6. Moderate to Severe Aortic Stenosis - currently undergoing workup for AVR by his primary  Cardiologist. - repeat echo is pending  7. HTN - Uncontrolled on admission 173/108. Current reading 142/78.  - Will resume half home dose to amlodipine to  qd.   Lorelei Pont PA-C Pager [phone]  Patient seen and examined. Agree with assessment and plan. Data reviewed from Care Everywhere. Pt is s/p CABG in 2002 and had cath ~ 6 weeks ago. He was told grafts were patent. He has a h/o PAF and had been on eliquis but tripped on 12/15 and fell leading to DC NOAC at least temporarily. Prior to fall pt had been on amlodipine and lisinopril which apparently were dc'd at that time. Yesterday he experienced recurrent chest pain, which ultimately resolved prior to treatment at [facility] ER. He has not had any recurrent chest pain. BP was elevated yesterday. Troponins are only very minimally elevated; doubt true nonstemi. He has not had recurrent AF so far on telemetry. Will obtain cath record. Will re-initiate anti-ischemic med Rx with amlodipine but at a lower dose of 5 mg than previously and add low dose nitrates. ECG today reveals mild T wave abnormality in V3 c/w yesterday; will obtain serial ECG.  Will try to ambulate today. If he remains stable possible dc tomorrow with f/u with his cardiologist Dr. [name] in [place] area and with primary Dr. Mikey Bussing with possible re-initiation of NOAC therapy to be decided by his home physicians.   Lennette Bihari, MD, [facility] 04/08/2015 10:30 AM

## 2015-04-08 NOTE — Progress Notes (Signed)
UR Completed Jaze Rodino Graves-Bigelow, RN,BSN [phone]  

## 2015-04-08 NOTE — Progress Notes (Addendum)
ANTICOAGULATION CONSULT NOTE  Pharmacy Consult for heparin Indication: chest pain/ACS  Allergies  Allergen Reactions  . Penicillins Rash    Has patient had a PCN reaction causing immediate rash, facial/tongue/throat swelling, SOB or lightheadedness with hypotension: Yes Has patient had a PCN reaction causing severe rash involving mucus membranes or skin necrosis: No Has patient had a PCN reaction that required hospitalization No Has patient had a PCN reaction occurring within the last 10 years: No If all of the above answers are "NO", then may proceed with Cephalosporin use.     Patient Measurements: Height:  (185.4 cm) Weight: 151 lb 9.6 oz (68.765 kg) IBW/kg (Calculated) : 79.9 Heparin Dosing Weight: 67.7 kg  Vital Signs: Temp: 97.7 F (36.5 C) (01/31 1559) Temp Source: Oral (01/31 1559) BP: 140/65 mmHg (01/31 1156) Pulse Rate: 58 (01/31 1559)  Labs:  Recent Labs  04/07/15 2138 04/07/15 2230 04/08/15 0231 04/08/15 0709 04/08/15 0941 04/08/15 1652  HGB  --   --   --   --  13.2  --   HCT  --   --   --   --  40.0  --   PLT  --   --   --   --  235  --   LABPROT  --   --   --   --  14.3  --   INR  --   --   --   --  1.09  --   HEPARINUNFRC  --  0.72*  --  0.90*  --  0.70  CREATININE  --   --   --   --  0.78  --   TROPONINI 0.06*  --  0.06*  --  0.05*  --     Estimated Creatinine Clearance: 66.9 mL/min (by C-G formula based on Cr of 0.78).  Assessment: 69 YOM transferred from [facility] for chest pain. Started on IV prior to transfer- first few levels elevated. Most recently, heparin level in therapeutic range, but at very top with result of 0.7 units/mL. No bleeding noted, Hgb 13.2, plt 235.  He has hx of afib, but not on anticoagulation prior to this admission (per notes, Eliquis stopped d/t anemia and a fall).  Goal of Therapy:  Heparin level 0.3-0.7 units/ml Monitor platelets by anticoagulation protocol: Yes   Plan:  - Decrease heparin infusion  to 900 units/hr  - Next heparin level with AM labs - Daily heparin level and CBC  Genie Mirabal D. Kabao Leite, PharmD, BCPS Clinical Pharmacist Pager: [phone] 04/08/2015 5:59 PM

## 2015-04-08 NOTE — Progress Notes (Signed)
ANTICOAGULATION CONSULT NOTE - Consult  Pharmacy Consult for heparin Indication: chest pain/ACS  Allergies  Allergen Reactions  . Penicillins Rash    Has patient had a PCN reaction causing immediate rash, facial/tongue/throat swelling, SOB or lightheadedness with hypotension: Yes Has patient had a PCN reaction causing severe rash involving mucus membranes or skin necrosis: No Has patient had a PCN reaction that required hospitalization No Has patient had a PCN reaction occurring within the last 10 years: No If all of the above answers are "NO", then may proceed with Cephalosporin use.     Patient Measurements: Height:  (185.4 cm) Weight: 151 lb 9.6 oz (68.765 kg) IBW/kg (Calculated) : 79.9 Heparin Dosing Weight: 67.7 kg  Vital Signs: Temp: 98.5 F (36.9 C) (01/31 0751) Temp Source: Oral (01/31 0751) BP: 142/78 mmHg (01/31 0751) Pulse Rate: 66 (01/31 0751)  Labs:  Recent Labs  04/07/15 2138 04/07/15 2230 04/08/15 0231 04/08/15 0709 04/08/15 0941  HGB  --   --   --   --  13.2  HCT  --   --   --   --  40.0  PLT  --   --   --   --  235  HEPARINUNFRC  --  0.72*  --  0.90*  --   TROPONINI 0.06*  --  0.06*  --   --     CrCl cannot be calculated (Patient has no serum creatinine result on file.).   Medical History: Past Medical History  Diagnosis Date  . CAD (coronary artery disease)     Multivessel status post CABG 2002  . Essential hypertension   . Depression   . Type 2 diabetes mellitus (HCC)   . PAF (paroxysmal atrial fibrillation) (HCC)     On Sotalol (previously Eliquis - stopped 02/2015)  . Aortic stenosis     Moderate to severe May 2016  . Vitamin D deficiency   . Diabetic nephropathy (HCC)   . BPH (benign prostatic hyperplasia)   . GERD (gastroesophageal reflux disease)   . Chronic systolic heart failure (HCC)     LVEF 45-50% May 2016  . Medication intolerance     Amiodarone - nausea and emesis  . History of fall     Right wrist fracture and  facial contusion 02/2015, possibly associated with orthostasis  . Iron deficiency anemia     Assessment: Gary Beck transferred from [facility] for chest pain. IV heparin 1200 units/hr was started prior to transfer at around 1600. He also received 4100 units bolus. Hgb 13.2, plt 235, he has hx of afib, but not on anticoagulation prior to this admission.  Goal of Therapy:  Heparin level 0.3-0.7 units/ml Monitor platelets by anticoagulation protocol: Yes   Plan:  - Decrease heparin infusion to 1000 units/hr for now.  - Check 8 hr heparin level  - Daily heparin level and CBC  Thank you for allowing [place] to participate in this patients care. Signe Colt, PharmD   04/08/2015,9:51 AM

## 2015-04-09 ENCOUNTER — Inpatient Hospital Stay (HOSPITAL_COMMUNITY): Payer: Medicare (Managed Care)

## 2015-04-09 DIAGNOSIS — I350 Nonrheumatic aortic (valve) stenosis: Secondary | ICD-10-CM

## 2015-04-09 DIAGNOSIS — I420 Dilated cardiomyopathy: Secondary | ICD-10-CM

## 2015-04-09 DIAGNOSIS — R079 Chest pain, unspecified: Secondary | ICD-10-CM

## 2015-04-09 LAB — BASIC METABOLIC PANEL
Anion gap: 10 (ref 5–15)
BUN: 13 mg/dL (ref 6–20)
CHLORIDE: 100 mmol/L — AB (ref 101–111)
CO2: 31 mmol/L (ref 22–32)
Calcium: 8.1 mg/dL — ABNORMAL LOW (ref 8.9–10.3)
Creatinine, Ser: 0.91 mg/dL (ref 0.61–1.24)
GFR calc non Af Amer: >60 mL/min (ref >60)
GLUCOSE: 153 mg/dL — AB (ref 65–99)
POTASSIUM: 3.8 mmol/L (ref 3.5–5.1)
Sodium: 141 mmol/L (ref 135–145)

## 2015-04-09 LAB — HEPARIN LEVEL (UNFRACTIONATED)
Heparin Unfractionated: 0.73 IU/mL — ABNORMAL HIGH (ref 0.30–0.70)
Heparin Unfractionated: <0.1 IU/mL — ABNORMAL LOW (ref 0.30–0.70)

## 2015-04-09 LAB — GLUCOSE, CAPILLARY
GLUCOSE-CAPILLARY: 101 mg/dL — AB (ref 65–99)
GLUCOSE-CAPILLARY: 135 mg/dL — AB (ref 65–99)
Glucose-Capillary: 143 mg/dL — ABNORMAL HIGH (ref 65–99)
Glucose-Capillary: 236 mg/dL — ABNORMAL HIGH (ref 65–99)

## 2015-04-09 MED ORDER — AMLODIPINE BESYLATE 5 MG PO TABS: 5.0000 mg | ORAL_TABLET | Freq: Every day | ORAL | Status: DC

## 2015-04-09 MED ORDER — ISOSORBIDE MONONITRATE ER 30 MG PO TB24: 30.0000 mg | ORAL_TABLET | Freq: Every day | ORAL | Status: DC

## 2015-04-09 MED ORDER — NITROGLYCERIN 0.4 MG SL SUBL: 0.4000 mg | SUBLINGUAL_TABLET | SUBLINGUAL | Status: DC | PRN

## 2015-04-09 NOTE — Progress Notes (Addendum)
Patient Name: Gary Beck Date of Encounter: 04/09/2015   Primary Physician: [name], Gary Beck Primary Cardiologist: Dr. Marisue Brooklyn - [facility]   Pt. profile:Gary Beck is a 82 y.o. male with past medical history of CAD (s/p 3-vessel CABG in 2002), HTN, paroxysmal atrial fibrillation (not on anticoagulation since 02/2015), HLD, chronic systolic CHF (EF 16-10% by echo in May 2016), and moderate to severe aortic stenosis who presents to [facility] on 04/07/2015 as a transfer from [facility] for chest pain.  (see H&P for recent evaluation of AS and fall hx)  SUBJECTIVE  Feeling well. No chest pain, sob or palpitations.   CURRENT MEDS . amLODipine  5 mg Oral Daily  . antiseptic oral rinse  7 mL Mouth Rinse BID  . aspirin EC  81 mg Oral Daily  . atorvastatin  40 mg Oral q1800  . DULoxetine  60 mg Oral Daily  . feeding supplement (ENSURE ENLIVE)  237 mL Oral BID BM  . feeding supplement (GLUCERNA SHAKE)  237 mL Oral TID BM  . furosemide  20 mg Oral Daily  . insulin aspart  0-15 Units Subcutaneous TID WC  . isosorbide mononitrate  30 mg Oral Daily  . pantoprazole  40 mg Oral Daily  . sotalol  120 mg Oral Daily    OBJECTIVE  Filed Vitals:   04/09/15 0436 04/09/15 0437 04/09/15 0500 04/09/15 0900  BP:  118/60    Pulse:  63 62 61  Temp: 98.3 F (36.8 C)     TempSrc: Oral     Resp:  14 14 18   Height:      Weight: 148 lb 12.8 oz (67.495 kg)     SpO2:  94% 97% 97%    Intake/Output Summary (Last 24 hours) at 04/09/15 1254 Last data filed at 04/09/15 1100  Gross per 24 hour  Intake 1615.16 ml  Output   1225 ml  Net 390.16 ml   Filed Weights   04/07/15 2044 04/08/15 0500 04/09/15 0436  Weight: 149 lb 6.4 oz (67.767 kg) 151 lb 9.6 oz (68.765 kg) 148 lb 12.8 oz (67.495 kg)    PHYSICAL EXAM  General: Pleasant, elderly male in NAD. Neuro: Alert and oriented X 3. Moves all extremities spontaneously. Psych: Normal affect. HEENT:  Normal  Neck: Supple  without bruits or JVD. Lungs:  Resp regular and unlabored, CTA. Heart: RRR no s3, s4/ 3/6 systolic murmurs. Abdomen: Soft, non-tender, non-distended, BS + x 4.  Extremities: No clubbing, cyanosis or edema. DP/PT/Radials 2+ and equal bilaterally.  Accessory Clinical Findings  CBC  Recent Labs  04/08/15 0941  WBC 5.9  HGB 13.2  HCT 40.0  MCV 90.3  PLT 235   Cardiac Enzymes  Recent Labs  04/07/15 2138 04/08/15 0231 04/08/15 0941  TROPONINI 0.06* 0.06* 0.05*   Fasting Lipid Panel  Recent Labs  04/08/15 0231  CHOL 142  HDL 46  LDLCALC 82  TRIG 68  CHOLHDL 3.1    TELE  Sinus rhythm with PACS  Radiology/Studies  No results found.  ASSESSMENT AND PLAN   1. Atypical chest pain/Elevated troponin [facility] for further evaluation of atypical chest pain and minimally abnormal troponin I (0.036). On IV heparin.  - Troponin 0.06-->0.06-->0.05 - ECG yesterday showed TWI in lead V3 yesterday, resolved today.  - Resolved chest pain. Continue ASA, Norvasc, statin and imdur. Further titrate as needed by primary cardiologist.    2. CAD - CAD, s/p 3-vessel bypass in 2002.  - Reviewed  cath report 02/19/2015 -->severe diffuse coronary disease with adequate perfusion to all area of the heart except first diagonal (totally occluded) which has patent graft. Moderate AS. Gary Beck to review.   3. Paroxysmal Atrial Fibrillation - Maintaining sinus rhythm  - This patients CHA2DS2-VASc Score and unadjusted Ischemic Stroke Rate (% per year) is equal to 9.7 % stroke rate/year from a score of 6 (CHF, HTN, DM, Vascular, Age (2). Was on Eliquis in 02/2015. This was discontinued due to anemia and a fall. Hgb has been stable.  - Continue Sotalol for rate control. Currently on IV heparin.  - F/u with cardiologist Dr. [name] in [place] area and with primary Dr. Mikey Bussing with possible re-initiation of NOAC therapy to be decided by his home physicians early next week.   4.  HLD - continue statin  5. Chronic Systolic CHF - EF 45-50% by echo in 07/2014. - Continue  PO Lasix  daily.  6. Moderate to Severe Aortic Stenosis - currently undergoing workup for AVR by his primary Cardiologist. - repeat echo reading is pending  7. HTN - Stable now  Signed, Gary Beck,Gary Beck Pager [phone]   Patient seen and examined. Agree with assessment and plan.  Patient feels well.  No recurrent chest pain. Patient is now back on a lower dose of amlodipine and low-dose nitrates.  He is maintaining sinus rhythm without recurrent AF.  I have obtained the catheterization data from 02/19/2015 done at [facility] by Dr. Marisue Brooklyn.  The patient had undergone a right left heart catheterization at that time.  His right atrial pressure mean was 8, RV pressure 34/10, PA pressure 33/76, and PCWP pressure mean of 13.  His left ventricular pressure was 167/18 and aortic pressure 138/60.  Thermodilution cardiac output was 4.4 L/m with an index of 2.3.  His Fick cardiac output was 5.4 with an index of 2.8.  He is a mean transvalvular aortic gradient of 19 mm with reported calculated valve area of 1.2 cm. He was found to have an occluded first diagonal branch of his LAD with an 80% ostial stenosis and the second diagonal branch.  After the first diagonal the LAD was occluded.  The LIMA graft was widely patent and the vein graft into the second diagonal vessel was patent.  The first diagonal vessel had poor filling in with small vessel.  There was 80-90% circumflex stenosis with a patent vein graft.  The PDA had 50% stenosis.  Of note, at that time his ejection fraction was 50%.   I was planning to discharge the patient home today, but right after I had seen him, the report of his echo Doppler study came available, which was now clearly different.  I have personally reviewed the echocardiographic images.  Today's echo now shows  an ejection fraction of 15% with diffuse  hypokinesis and grade 3 diastolic dysfunction.  He was felt to have severe low gradient low output aortic valve stenosis with a mean gradient of 14.  The left atrium was severely dilated and there was mild dilatation of the RA. I long discussion with both the patient and his wife and reviewed the new findings seen on today's echo.  Based on the marked change in his LV function from 6 weeks ago with his EF dropping from 50% to 15% and with  probable severe low gradient aortic stenosis, I have canceled the patient's DC and have recommended a surgical evaluation for consideration of possible candidacy TAVR.  Dr. Laneta Simmers was consulted who  will be seeing the patient later today.   Lennette Bihari, Gary Beck, [facility] 04/09/2015 2:17 PM

## 2015-04-09 NOTE — Progress Notes (Signed)
ANTICOAGULATION CONSULT NOTE  Pharmacy Consult for heparin Indication: chest pain/ACS  Allergies  Allergen Reactions  . Penicillins Rash    Has patient had a PCN reaction causing immediate rash, facial/tongue/throat swelling, SOB or lightheadedness with hypotension: Yes Has patient had a PCN reaction causing severe rash involving mucus membranes or skin necrosis: No Has patient had a PCN reaction that required hospitalization No Has patient had a PCN reaction occurring within the last 10 years: No If all of the above answers are "NO", then may proceed with Cephalosporin use.     Patient Measurements: Height:  (185.4 cm) Weight: 148 lb 12.8 oz (67.495 kg) IBW/kg (Calculated) : 79.9 Heparin Dosing Weight: 67.7 kg  Vital Signs: Temp: 97.5 F (36.4 C) (02/01 1356) Temp Source: Oral (02/01 1356) BP: 117/65 mmHg (02/01 1356) Pulse Rate: 59 (02/01 1356)  Labs:  Recent Labs  04/07/15 2138  04/08/15 0231  04/08/15 0941 04/08/15 1652 04/09/15 0604 04/09/15 1647  HGB  --   --   --   --  13.2  --   --   --   HCT  --   --   --   --  40.0  --   --   --   PLT  --   --   --   --  235  --   --   --   LABPROT  --   --   --   --  14.3  --   --   --   INR  --   --   --   --  1.09  --   --   --   HEPARINUNFRC  --   < >  --   < >  --  0.70 0.73* <0.10*  CREATININE  --   --   --   --  0.78  --  0.91  --   TROPONINI 0.06*  --  0.06*  --  0.05*  --   --   --   < > = values in this interval not displayed.  Estimated Creatinine Clearance: 57.7 mL/min (by C-G formula based on Cr of 0.91).  Assessment: 52 YOM transferred from [facility] for chest [facility]. Started on IV prior to transfer- first few levels elevated. Most recently, heparin level dropped to undetectable. Went down for ECHO earlier today and apparently gtt was stopped for several hours. Rate was restarted at 800 units/hr around 1600 today. No bleeding noted, Hgb 13.2, plt 235.  Goal of Therapy:  Heparin level 0.3-0.7  units/ml Monitor platelets by anticoagulation protocol: Yes   Plan:  Continue heparin gtt at 800 units/hr Check 8 hr HL Monitor daily HL, CBC, s/s of bleed  Enzo Bi, PharmD, [facility] Clinical Pharmacist Pager [phone] 04/09/2015 6:03 PM

## 2015-04-09 NOTE — Progress Notes (Signed)
Pt heprin was off when they returned from having their echocardiogram. Restarted appoxi. 1hr after their return when I noticed it was cut off. Pharmacy aware, instructed to keep it running at 800. Will retake lab later in the evening.

## 2015-04-09 NOTE — Discharge Summary (Signed)
Discharge Summary    Patient ID: Gary Beck,  MRN: [id number], DOB/AGE: May 02, 1928 82 y.o.  Admit date: 04/07/2015 Discharge date: 04/12/2015  Primary Care Provider: [facility] Primary Cardiologist: Dr. Marisue [facility]  Discharge Diagnoses       Atypical chest pain   Elevated troponin   Severe Aortic stenosis   CAD s/p CABG   HLD   PAF   HTN   DM   Chronic systolic CHF      Allergies Allergies  Allergen Reactions  . Penicillins Rash    Has patient had a PCN reaction causing immediate rash, facial/tongue/throat swelling, SOB or lightheadedness with hypotension: Yes Has patient had a PCN reaction causing severe rash involving mucus membranes or skin necrosis: No Has patient had a PCN reaction that required hospitalization No Has patient had a PCN reaction occurring within the last 10 years: No If all of the above answers are "NO", then may proceed with Cephalosporin use.     Diagnostic Studies/Procedures     Transthoracic Echocardiography 04/09/15  LV EF: 15% -  20%  ------------------------------------------------------------------- Indications:   Chest pain 786.51.  ------------------------------------------------------------------- History:  PMH:  Atrial fibrillation. Coronary artery disease. Aortic valve disease. Risk factors: Former tobacco use. Hypertension. Diabetes mellitus.  ------------------------------------------------------------------- Study Conclusions  - Left ventricle: The cavity size was moderately dilated. Systolic function was severely reduced. The estimated ejection fraction was in the range of 15% to 20%. Diffuse hypokinesis. Doppler parameters are consistent with a reversible restrictive pattern, indicative of decreased left ventricular diastolic compliance and/or increased left atrial pressure (grade 3 diastolic dysfunction). - Aortic valve: Trileaflet; severely thickened, severely  calcified leaflets. Cusp separation was severely reduced. Findings suggestive of severe stenosis (low gradient, low output). There was mild regurgitation. Peak velocity (S): 270 cm/s. Mean gradient (S): 14 mm Hg. Valve area (VTI): 0.99 cm^2. Valve area (Vmax): 1 cm^2. Valve area (Vmean): 0.87 cm^2. - Mitral valve: Calcified annulus. There was moderate regurgitation. - Left atrium: The atrium was severely dilated. - Right atrium: The atrium was mildly dilated.   ECHO DOBUTAMINE STRESS TEST WO IMAGE AGENT 04/10/15   LV EF: 15% -  20%  ------------------------------------------------------------------- Indications:   Aortic stenosis 424.1.  ------------------------------------------------------------------- Study Conclusions  - Left ventricle: The cavity size was moderately reduced. Wall thickness was normal. Systolic function was severely reduced. The estimated ejection fraction was in the range of 15% to 20%. Severe diffuse hypokinesis. - Aortic valve: Transvalvular velocity was increased less than expected, due to low cardiac output. There was moderate to severe stenosis. Valve area (VTI): 1.05 cm^2. Valve area (Vmax): 0.96 cm^2. Valve area (Vmean): 1.09 cm^2. - Staged echo: There was evidence of moderate myocardial recruitment with IV dobutamine. All wall segments appear at least partly viable, with the exception of the mid-distal anterior septum, which may be mostly scar.  Impressions:  - There is moderate to severe aortic stenosis (&quot;low gradient aortic stenosis&quot; due to impaired LV function). Improvement in myocardial function is likely after treatment of aortic stenosis.  Pulmonary function test 04/11/15  Status: EditedResult-FINAL Visible to patient:  Not Released Nextappt: None Dx:  Severe aortic stenosis         Ref Range 1d ago   FVC-Pre L 3.20  FVC-%Pred-Pre % 73  FVC-Post L 3.06    FVC-%Pred-Post % 70  FVC-%Change-Post % -4  FEV1-Pre L 2.13  FEV1-%Pred-Pre % 69  FEV1-Post L 2.20  FEV1-%Pred-Post % 71  FEV1-%Change-Post % 3  FEV6-Pre L  3.10  FEV6-%Pred-Pre % 76  FEV6-Post L 3.06  FEV6-%Pred-Post % 75  FEV6-%Change-Post % -1  Pre FEV1/FVC ratio % 67  FEV1FVC-%Pred-Pre % 93  Post FEV1/FVC ratio % 72  FEV1FVC-%Change-Post % 7  Pre FEV6/FVC Ratio % 97  FEV6FVC-%Pred-Pre % 104  Post FEV6/FVC ratio % 100  FEV6FVC-%Pred-Post % 107  FEV6FVC-%Change-Post % 3  FEF 25-75 Pre L/sec 1.06  FEF2575-%Pred-Pre % 51  FEF 25-75 Post L/sec 1.22  FEF2575-%Pred-Post % 59  FEF2575-%Change-Post % 15          History of Present Illness     Gary Beck is a 82 y.o. male with past medical history of CAD (s/p 3-vessel CABG in 2002), HTN, paroxysmal atrial fibrillation (not on anticoagulation since 02/2015), HLD, chronic systolic CHF (EF 45-40% by echo in May 2016), and moderate to severe aortic stenosis who presents to [facility] on 04/07/2015 as a transfer from [facility] for chest pain.   History includes multivessel CAD status post CABG in 2002. He has undergone recent cardiac catheterization December 2016 @ [facility] for further evaluation of his aortic stenosis. This demonstrated a mean transaortic valve gradient of 19 mmHg. His aortic valve area calculated to 0.93 cm. All of his bypass grafts were patent and there were no areas for revascularization. Ongoing medical therapy was recommended (this result was obtained during admission). He also has documented PAF diagnosed within the last 6 months, at this point is on sotalol having not tolerated amiodarone due to nausea and emesis. He was at one point on Eliquis, but this was stopped in December 2016 around the time of his cardiac catheterization, and he has not been reinstituted on any anticoagulation as yet. Further complicated matters is a fall that he had in December 2016 resulting in right wrist fracture and a facial  contusion as well as severe anemia. This may have also factored into the decision regarding reinstitution of anticoagulation but it is not entirely clear.   He states that earlier day of 04/07/15 he began experiencing a shooting, almost electric-like sensation in his chest, seems to indicate that it was in association with his heartbeat, but does not specifically report any rapid palpitations, had no shortness of breath, no dizziness or syncope. When seen in the [facility] ER he states that his symptoms had actually already resolved. His ECG shows an incomplete left bundle branch block which is reportedly old and he does look to be in sinus rhythm at least by one tracing showing an indistinct P wave with prolonged PR interval, the other tracing looks more like a junctional rhythm. His troponin I level was minimally increased at 0.036. The patient started on IV heparin and transferred with fairly atypical chest pain and equivocal troponin I level.   Hospital Course     Consultants: None  The patient was admitted chest pain observation. One episode of chest pain overnight, resolved with SL nitro x 1. No further episode. Troponin trend was 0.06->0.06->0.05. It was felt that prior to fall pt had been on amlodipine and lisinopril which apparently were dc'd at that time. Re-initiated anti-ischemic med Rx with amlodipine but at a lower dose of 5 mg than previously and added low dose nitrates. BP was stable. He  maintained sinus rhythm without recurrent AF.Obtained and reviewed cath report from [facility] (severe diffuse coronary disease with adequate perfusion to all area of the heart except first diagonal (totally occluded) which has patent graft. Moderate AS - LV EF of 50%). Echo during admission 04/09/15  showed LV EF of 15-20%, diffuse hypokinesis, grade 3 DD, Severe AS (low gradient, low output), moderate MR, severely dilated LA, mildly dilated RA.   The multidisciplinary valve team is now asked to evaluate this  patient with marked change in LV function. His echocardiograms since May 2016 have shown a progressive decline in his LV systolic function with a corresponding decline in the AV gradient. The patient was seen by Dr. Laneta Simmers (Cardiothoracic surgeon) and felt not a candidate for redo sternotomy for open surgical AVR at 84 with poor LV function and TAVR may be a reasonable alternative although not sure if replacing his aortic will result in improved LV function and continued functional independence. Next day the patient was seen by Dr. [facility] specialist) who also felt that his transaortic velocities have decreased over the past year, but this appears to be related to worsening LV systolic function. The patient is very functional but has many co-morbidities. Recommended a dobutamine stress echocardiogram to evaluate Myocardial reserve and better assess for true low-gradient severe AS which showed evidence of moderate myocardialrecruitment with IV dobutamine (detailed result as above). Recommended TAVR for treatment of his aortic stenosis.  The patient decided to proceeded with TAVR at [facility] and underwent TVAR workup. Seen by Dr. Tyrone Sage for second surgical options who also preferred TVAR compared to standard redo cardiac surgery and AVR. CT changes in the liver strongly suggestive of cirrhosis, with suspicious hypervascular area in the right lobe of the liver - will need evaluation in future primary team aware of findings. The patient will be scheduled to undergo TAVR surgery next Tuesday. The office will call with further detailed. He was anticoagulated with Heparin during admission. Will held anticoagulation (was on eliquis prior to cath 02/2015) for surgery in next 3 days.   The patient has been seen by Dr. Tresa Endo and deemed ready for discharge home. All follow-up appointments have been scheduled. Discharge medications are listed below.    Discharge Vitals Blood pressure 130/67, pulse 60,  temperature 98 F (36.7 C), temperature source Oral, resp. rate 18, height 6\' 1"  (1.854 m), weight 154 lb 6.4 oz (70.035 kg), SpO2 94 %.  Filed Weights   04/09/15 0436 04/10/15 0500 04/12/15 0511  Weight: 148 lb 12.8 oz (67.495 kg) 150 lb (68.04 kg) 154 lb 6.4 oz (70.035 kg)    Labs & Radiologic Studies     CBC  Recent Labs  04/11/15 0420 04/12/15 0433  WBC 6.1 6.9  HGB 12.7* 12.9*  HCT 38.9* 39.1  MCV 92.8 92.7  PLT 215 219   Basic Metabolic Panel  Recent Labs  04/11/15 0420  NA 141  K 4.0  CL 102  CO2 29  GLUCOSE 176*  BUN 18  CREATININE 0.91  CALCIUM 8.1*     Ct Coronary Morp W/cta Cor W/score W/ca W/cm &/or Wo/cm  04/11/2015  ADDENDUM REPORT: 04/11/2015 17:26 CLINICAL DATA:  Aortic stenosis EXAM: Cardiac TAVR CT TECHNIQUE: The patient was scanned on a Philips 256 scanner. A 120 kV retrospective scan was triggered in the descending thoracic aorta at 111 HU's. Gantry rotation speed was 270 msecs and collimation was .9 mm. No beta blockade or nitro were given. The 3D data set was reconstructed in 5% intervals of the R-R cycle. Systolic and diastolic phases were analyzed on a dedicated work station using MPR, MIP and VRT modes. The patient received 80 cc of contrast. FINDINGS: Aortic Valve: Tri-leaflet and moderately calcified Mild MAC. No LAA thrombus Aorta: Mild root  dilatation. Nearly circumferential calcification of the STJ. Moderate calcification of the arch with no atheroma and normal origin of the great vessels Sinotubular Junction:  30 mm Ascending Thoracic Aorta:  37 mm Aortic Arch:  32 mm Descending Thoracic Aorta:  28 mm Sinus of Valsalva Measurements: Non-coronary:  33 mm Right -coronary:  31 mm Left -coronary:  34 mm Coronary Artery Height above Annulus: Left Main:  14 mm Right Coronary:  16 mm Virtual Basal Annulus Measurements: Maximum/Minimum Diameter:  29.8 x 22.5 mm Perimeter:  82 mm Area:  509 mm2 Coronary Arteries:  Sufficient height above annulus for  deployment Optimum Fluoroscopic Angle for Delivery: RAO 2 degrees Cranial 0 degrees IMPRESSION: 1) Moderately calcified tri-leaflet aortic valve with annulus 509 mm2 suitable for a 26 mm Sapien 3 valve 2) Sufficient coronary height for delivery 3) Near circumferential calcification of the STJ 4) Optimum angiographic angle for delivery RAO 2 degrees Cranial 0 degrees 5) No LAA thrombus 6) Moderate calcification of the aortic arch Charlton Haws Electronically Signed   By: Charlton Haws M.D.   On: 04/11/2015 17:26  04/11/2015  EXAM: OVER-READ INTERPRETATION  CT CHEST The following report is an over-read performed by radiologist Dr. Royal Piedra [facility] on 04/11/2015. This over-read does not include interpretation of cardiac or coronary anatomy or pathology. The coronary calcium score/coronary CTA interpretation by the cardiologist is attached. COMPARISON:  No priors. FINDINGS: Extracardiac findings will be dictated under separate accession number for contemporaneously obtained CTA of the chest, abdomen and pelvis obtained on 04/11/2015. IMPRESSION: See separate dictation for extracardiac findings condyle reported on contemporaneously obtained CTA of the chest, abdomen and pelvis dated 04/11/2015. Electronically Signed: By: Trudie Reed M.D. On: 04/11/2015 14:45   Ct Angio Chest Aorta W/cm &/or Wo/cm  04/11/2015  CLINICAL DATA:  82 year old male with history of severe aortic stenosis. Preprocedural study prior to potential transcatheter aortic valve replacement (TAVR). EXAM: CT ANGIOGRAPHY CHEST, ABDOMEN AND PELVIS TECHNIQUE: Multidetector CT imaging through the chest, abdomen and pelvis was performed using the standard protocol during bolus administration of intravenous contrast. Multiplanar reconstructed images and MIPs were obtained and reviewed to evaluate the vascular anatomy. CONTRAST:  75mL OMNIPAQUE IOHEXOL 350 MG/ML SOLN, 75mL OMNIPAQUE IOHEXOL 350 MG/ML SOLN COMPARISON:  No priors.  FINDINGS: CTA CHEST FINDINGS Mediastinum/Lymph Nodes: Heart size is enlarged with left ventricular and left atrial dilatation. There is no significant pericardial fluid, thickening or pericardial calcification. There is atherosclerosis of the thoracic aorta, the great vessels of the mediastinum and the coronary arteries, including calcified atherosclerotic plaque in the left main, left anterior descending, left circumflex and right coronary arteries. Status post median sternotomy for CABG, including LIMA to the LAD. There is also a saphenous vein graft to the obtuse marginal distribution. Severe thickening calcification of the aortic valve. Calcification of the mitral annulus. No pathologically enlarged mediastinal or hilar lymph nodes. Esophagus is unremarkable in appearance. No axillary lymphadenopathy. Lungs/Pleura: Moderate bilateral pleural effusions are simple in appearance layering dependently. These are associated with areas of passive atelectasis in the lower lobes of the lungs bilaterally. No acute consolidative airspace disease. There are some patchy areas of ground-glass attenuation and interlobular septal thickening, favored to reflect a background of very mild interstitial pulmonary edema. No suspicious appearing pulmonary nodules or masses are noted in the well aerated portions of the lungs. Musculoskeletal/Soft Tissues: Median sternotomy wires. Healing nondisplaced fractures of the right fourth, fifth and sixth ribs anteriorly, as well as the right seventh rib laterally.  There are no aggressive appearing lytic or blastic lesions noted in the visualized portions of the skeleton. CTA ABDOMEN AND PELVIS FINDINGS Hepatobiliary: The liver has a slightly shrunken appearance and nodular contour, suggestive of underlying cirrhosis. In the central aspect of the right lobe of the liver between segments 7 and 8 (image 133 of series 401) there is a somewhat ill-defined hypervascular area estimated to measure 1.9  x 2.3 cm. Immediately adjacent to this extending superiorly from this area there is a branching hypodensity, which appears likely to be within the distribution of the right hepatic vein. Findings are concerning for potential small hepatocellular carcinoma with associated tumor thrombus in the right hepatic vein. There is an additional ill-defined hypervascular area in the inferior aspect of segment 5 of the liver measuring 1.7 x 2.8 cm (image 183 of series 401). No intra or extrahepatic biliary ductal dilatation. Numerous calcified gallstones lie dependently in the gallbladder. No current findings to suggest an acute cholecystitis at this time. Pancreas: No pancreatic mass. No pancreatic ductal dilatation. No pancreatic or peripancreatic fluid or inflammatory changes. Spleen: Unremarkable. Adrenals/Urinary Tract: Bilateral adrenal glands are normal in appearance. Tiny simple cysts in the upper pole of the left kidney. Sub cm low-attenuation lesion in the upper pole the right kidney is too small to definitively characterize, but is also favored to represent a small cyst. No hydroureteronephrosis or perinephric stranding to indicate urinary tract obstruction at this time. Urinary bladder is normal in appearance. Stomach/Bowel: Normal appearance of the stomach. No pathologic dilatation of small bowel or colon. The appendix is not confidently identified may be surgically absent. Regardless, there are no inflammatory changes noted adjacent to the cecum to suggest presence of an acute appendicitis at this time. Vascular/Lymphatic: Extensive atherosclerosis throughout the abdominal and pelvic vasculature, with vascular findings and measurements pertinent to potential TAVR procedure, as detailed below. No aneurysm or dissection identified in the abdominal or pelvic vasculature. No lymphadenopathy noted in the abdomen or pelvis. Reproductive: Prostate gland and seminal vesicles are unremarkable in appearance. Other: No  significant volume of ascites.  No pneumoperitoneum. Musculoskeletal: There are no aggressive appearing lytic or blastic lesions noted in the visualized portions of the skeleton. VASCULAR MEASUREMENTS PERTINENT TO TAVR: AORTA: Minimal Aortic Diameter -  12.7 x 13.8 mm Severity of Aortic Calcification -  moderate to severe RIGHT PELVIS: Right Common Iliac Artery - Minimal Diameter - 9.1 x 7.8 mm Tortuosity - mild Calcification - moderate to severe Right External Iliac Artery - Minimal Diameter - 8.6 x 8.3 mm Tortuosity - severe Calcification - none Right Common Femoral Artery - Minimal Diameter - 7.9 x 6.0 mm Tortuosity - mild Calcification - mild LEFT PELVIS: Left Common Iliac Artery - Minimal Diameter - 8.0 x 8.8 mm Tortuosity - mild Calcification - moderate to severe Left External Iliac Artery - Minimal Diameter - 8.4 x 8.1 mm Tortuosity - mild Calcification - none Left Common Femoral Artery - Minimal Diameter - 7.8 x 6.5 mm Tortuosity - mild Calcification - mild Review of the MIP images confirms the above findings. IMPRESSION: 1. Vascular findings and measurements pertinent to potential TAVR procedure, as detailed above. This patient does appear to have suitable pelvic arterial access bilaterally, however, left-sided vascular approach is considered far more optimal given the less severe tortuosity of the left external iliac artery. 2. Morphologic changes in the liver strongly suggestive of cirrhosis, with suspicious hypervascular area in the right lobe of the liver which is concerning for potential hepatocellular carcinoma. This  appears to be associated with potential tumor thrombus in branches of the right hepatic vein, as detailed above. There is an additional small hypervascular area in segment 5 of the liver as well. Further evaluation of these findings with MRI of the abdomen with and without IV gadolinium (preferably Eovist) is strongly recommended in the near future. 3. The appearance of the chest suggests  mild congestive heart failure, including mild interstitial pulmonary edema and moderate bilateral pleural effusions. 4. Thickening calcification of the aortic valve, compatible with the reported clinical history of severe aortic stenosis. 5. Cardiomegaly with left ventricular and left atrial dilatation. 6. Cholelithiasis without evidence of acute cholecystitis at this time. 7. Multiple healing nondisplaced right-sided rib fractures, as above. 8. Additional incidental findings, as above. These results were called by telephone at the time of interpretation on 04/11/2015 at 4:10 pm to Dr. Tonny Bollman, who verbally acknowledged these results. Electronically Signed   By: Trudie Reed M.D.   On: 04/11/2015 16:15   Ct Angio Abd/pel W/ And/or W/o  04/11/2015  CLINICAL DATA:  82 year old male with history of severe aortic stenosis. Preprocedural study prior to potential transcatheter aortic valve replacement (TAVR). EXAM: CT ANGIOGRAPHY CHEST, ABDOMEN AND PELVIS TECHNIQUE: Multidetector CT imaging through the chest, abdomen and pelvis was performed using the standard protocol during bolus administration of intravenous contrast. Multiplanar reconstructed images and MIPs were obtained and reviewed to evaluate the vascular anatomy. CONTRAST:  75mL OMNIPAQUE IOHEXOL 350 MG/ML SOLN, 75mL OMNIPAQUE IOHEXOL 350 MG/ML SOLN COMPARISON:  No priors. FINDINGS: CTA CHEST FINDINGS Mediastinum/Lymph Nodes: Heart size is enlarged with left ventricular and left atrial dilatation. There is no significant pericardial fluid, thickening or pericardial calcification. There is atherosclerosis of the thoracic aorta, the great vessels of the mediastinum and the coronary arteries, including calcified atherosclerotic plaque in the left main, left anterior descending, left circumflex and right coronary arteries. Status post median sternotomy for CABG, including LIMA to the LAD. There is also a saphenous vein graft to the obtuse marginal  distribution. Severe thickening calcification of the aortic valve. Calcification of the mitral annulus. No pathologically enlarged mediastinal or hilar lymph nodes. Esophagus is unremarkable in appearance. No axillary lymphadenopathy. Lungs/Pleura: Moderate bilateral pleural effusions are simple in appearance layering dependently. These are associated with areas of passive atelectasis in the lower lobes of the lungs bilaterally. No acute consolidative airspace disease. There are some patchy areas of ground-glass attenuation and interlobular septal thickening, favored to reflect a background of very mild interstitial pulmonary edema. No suspicious appearing pulmonary nodules or masses are noted in the well aerated portions of the lungs. Musculoskeletal/Soft Tissues: Median sternotomy wires. Healing nondisplaced fractures of the right fourth, fifth and sixth ribs anteriorly, as well as the right seventh rib laterally. There are no aggressive appearing lytic or blastic lesions noted in the visualized portions of the skeleton. CTA ABDOMEN AND PELVIS FINDINGS Hepatobiliary: The liver has a slightly shrunken appearance and nodular contour, suggestive of underlying cirrhosis. In the central aspect of the right lobe of the liver between segments 7 and 8 (image 133 of series 401) there is a somewhat ill-defined hypervascular area estimated to measure 1.9 x 2.3 cm. Immediately adjacent to this extending superiorly from this area there is a branching hypodensity, which appears likely to be within the distribution of the right hepatic vein. Findings are concerning for potential small hepatocellular carcinoma with associated tumor thrombus in the right hepatic vein. There is an additional ill-defined hypervascular area in the inferior aspect of  segment 5 of the liver measuring 1.7 x 2.8 cm (image 183 of series 401). No intra or extrahepatic biliary ductal dilatation. Numerous calcified gallstones lie dependently in the  gallbladder. No current findings to suggest an acute cholecystitis at this time. Pancreas: No pancreatic mass. No pancreatic ductal dilatation. No pancreatic or peripancreatic fluid or inflammatory changes. Spleen: Unremarkable. Adrenals/Urinary Tract: Bilateral adrenal glands are normal in appearance. Tiny simple cysts in the upper pole of the left kidney. Sub cm low-attenuation lesion in the upper pole the right kidney is too small to definitively characterize, but is also favored to represent a small cyst. No hydroureteronephrosis or perinephric stranding to indicate urinary tract obstruction at this time. Urinary bladder is normal in appearance. Stomach/Bowel: Normal appearance of the stomach. No pathologic dilatation of small bowel or colon. The appendix is not confidently identified may be surgically absent. Regardless, there are no inflammatory changes noted adjacent to the cecum to suggest presence of an acute appendicitis at this time. Vascular/Lymphatic: Extensive atherosclerosis throughout the abdominal and pelvic vasculature, with vascular findings and measurements pertinent to potential TAVR procedure, as detailed below. No aneurysm or dissection identified in the abdominal or pelvic vasculature. No lymphadenopathy noted in the abdomen or pelvis. Reproductive: Prostate gland and seminal vesicles are unremarkable in appearance. Other: No significant volume of ascites.  No pneumoperitoneum. Musculoskeletal: There are no aggressive appearing lytic or blastic lesions noted in the visualized portions of the skeleton. VASCULAR MEASUREMENTS PERTINENT TO TAVR: AORTA: Minimal Aortic Diameter -  12.7 x 13.8 mm Severity of Aortic Calcification -  moderate to severe RIGHT PELVIS: Right Common Iliac Artery - Minimal Diameter - 9.1 x 7.8 mm Tortuosity - mild Calcification - moderate to severe Right External Iliac Artery - Minimal Diameter - 8.6 x 8.3 mm Tortuosity - severe Calcification - none Right Common Femoral  Artery - Minimal Diameter - 7.9 x 6.0 mm Tortuosity - mild Calcification - mild LEFT PELVIS: Left Common Iliac Artery - Minimal Diameter - 8.0 x 8.8 mm Tortuosity - mild Calcification - moderate to severe Left External Iliac Artery - Minimal Diameter - 8.4 x 8.1 mm Tortuosity - mild Calcification - none Left Common Femoral Artery - Minimal Diameter - 7.8 x 6.5 mm Tortuosity - mild Calcification - mild Review of the MIP images confirms the above findings. IMPRESSION: 1. Vascular findings and measurements pertinent to potential TAVR procedure, as detailed above. This patient does appear to have suitable pelvic arterial access bilaterally, however, left-sided vascular approach is considered far more optimal given the less severe tortuosity of the left external iliac artery. 2. Morphologic changes in the liver strongly suggestive of cirrhosis, with suspicious hypervascular area in the right lobe of the liver which is concerning for potential hepatocellular carcinoma. This appears to be associated with potential tumor thrombus in branches of the right hepatic vein, as detailed above. There is an additional small hypervascular area in segment 5 of the liver as well. Further evaluation of these findings with MRI of the abdomen with and without IV gadolinium (preferably Eovist) is strongly recommended in the near future. 3. The appearance of the chest suggests mild congestive heart failure, including mild interstitial pulmonary edema and moderate bilateral pleural effusions. 4. Thickening calcification of the aortic valve, compatible with the reported clinical history of severe aortic stenosis. 5. Cardiomegaly with left ventricular and left atrial dilatation. 6. Cholelithiasis without evidence of acute cholecystitis at this time. 7. Multiple healing nondisplaced right-sided rib fractures, as above. 8. Additional incidental findings, as  above. These results were called by telephone at the time of interpretation on 04/11/2015  at 4:10 pm to Dr. Tonny Bollman, who verbally acknowledged these results. Electronically Signed   By: Trudie Reed M.D.   On: 04/11/2015 16:15    Disposition   Pt is being discharged home today in good condition.  Follow-up Plans & Appointments    Follow-up Information    Follow up with Tonny Bollman, MD.   Specialty:  Cardiology   Why:  office will with surgery   Contact information:   [address] [place] [address] [phone]       Follow up with Alleen Borne, MD.   Specialty:  Cardiothoracic Surgery   Contact information:   [address] [place] [address] [place] [address] [phone]      Discharge Instructions    Diet - low sodium heart healthy    Complete by:  As directed      Discharge instructions    Complete by:  As directed   Call Dr. Excell Seltzer office if you did not head anything by  Monday Afternoon. TVAR schedule for Tuesday 04/15/15     Increase activity slowly    Complete by:  As directed            Discharge Medications   Current Discharge Medication List    START taking these medications   Details  amLODipine (NORVASC) 5 MG tablet Take 1 tablet (5 mg total) by mouth daily. Qty: 30 tablet, Refills: 3    isosorbide mononitrate (IMDUR) 30 MG 24 hr tablet Take 1 tablet (30 mg total) by mouth daily. Qty: 30 tablet, Refills: 3    nitroGLYCERIN (NITROSTAT) 0.4 MG SL tablet Place 1 tablet (0.4 mg total) under the tongue every 5 (five) minutes x 3 doses as needed for chest pain. Qty: 25 tablet, Refills: 1      CONTINUE these medications which have NOT CHANGED   Details  aspirin EC 81 MG tablet Take 81 mg by mouth daily.    atorvastatin (LIPITOR) 40 MG tablet Take 40 mg by mouth daily.    DULoxetine (CYMBALTA) 60 MG capsule Take 120 mg by mouth daily.    furosemide (LASIX) 20 MG tablet Take 20 mg by mouth daily as needed for fluid or edema.    glimepiride (AMARYL) 4 MG tablet Take 4 mg by mouth daily with  breakfast.    metFORMIN (GLUCOPHAGE) 1000 MG tablet Take 1,000 mg by mouth 2 (two) times daily with a meal.    omeprazole (PRILOSEC) 20 MG capsule Take 20 mg by mouth daily.    sotalol (BETAPACE) 120 MG tablet Take 120 mg by mouth daily.          Outstanding Labs/Studies   None  Duration of Discharge Encounter   Greater than 30 minutes including physician time.  Signed, Era Parr PA-C 04/12/2015, 4:50 PM

## 2015-04-09 NOTE — Progress Notes (Signed)
  Echocardiogram 2D Echocardiogram has been performed.  Gary Beck 04/09/2015, 11:14 AM

## 2015-04-09 NOTE — Progress Notes (Signed)
Results for USMAN, MILLETT (MRN [id number]) as of 04/09/2015 10:35  Ref. Range 04/08/2015 11:55 04/08/2015 16:51 04/08/2015 20:10 04/08/2015 20:39 04/09/2015 07:40  Glucose-Capillary Latest Ref Range: 65-99 mg/dL 914 (H) 782 (H) 40 (LL) 105 (H) 143 (H)  Had low blood sugar last night of 40 mg/dl. Recommend changing Novolog correction scale to SENSITIVE TID. Patient only eating 20-25 % of meal with Glucerna supplements. Will continue to monitor during hospitalization. Smith Mince RN BSN CDE

## 2015-04-09 NOTE — Progress Notes (Signed)
ANTICOAGULATION CONSULT NOTE  Pharmacy Consult for heparin Indication: chest pain/ACS  Allergies  Allergen Reactions  . Penicillins Rash    Has patient had a PCN reaction causing immediate rash, facial/tongue/throat swelling, SOB or lightheadedness with hypotension: Yes Has patient had a PCN reaction causing severe rash involving mucus membranes or skin necrosis: No Has patient had a PCN reaction that required hospitalization No Has patient had a PCN reaction occurring within the last 10 years: No If all of the above answers are "NO", then may proceed with Cephalosporin use.     Patient Measurements: Height:  (185.4 cm) Weight: 148 lb 12.8 oz (67.495 kg) IBW/kg (Calculated) : 79.9 Heparin Dosing Weight: 67.7 kg  Vital Signs: Temp: 98.3 F (36.8 C) (02/01 0436) Temp Source: Oral (02/01 0436) Pulse Rate: 62 (02/01 0500)  Labs:  Recent Labs  04/07/15 2138  04/08/15 0231 04/08/15 0709 04/08/15 0941 04/08/15 1652 04/09/15 0604  HGB  --   --   --   --  13.2  --   --   HCT  --   --   --   --  40.0  --   --   PLT  --   --   --   --  235  --   --   LABPROT  --   --   --   --  14.3  --   --   INR  --   --   --   --  1.09  --   --   HEPARINUNFRC  --   < >  --  0.90*  --  0.70 0.73*  CREATININE  --   --   --   --  0.78  --  0.91  TROPONINI 0.06*  --  0.06*  --  0.05*  --   --   < > = values in this interval not displayed.  Estimated Creatinine Clearance: 57.7 mL/min (by C-G formula based on Cr of 0.91).  Assessment: 52 YOM transferred from [facility] for chest pain. Started on IV prior to transfer- first few levels elevated. Most recently, heparin level slightly supratherapeutic at 0.73 units/mL. No bleeding noted, Hgb 13.2, plt 235.  He has hx of afib, but not on anticoagulation prior to this admission (per notes, Eliquis stopped d/t anemia and a fall).  Goal of Therapy:  Heparin level 0.3-0.7 units/ml Monitor platelets by anticoagulation protocol: Yes    Plan:  - Decrease heparin infusion to 800 units/hr  - 8 hr heparin level - Daily heparin level and CBC  Thank you for allowing [place] to participate in this patients care. Signe Colt, PharmD Pager: [phone] 04/09/2015 9:32 AM

## 2015-04-09 NOTE — Consult Note (Signed)
[facility]  MULTIDISCIPLINARY HEART VALVE CLINIC    CARDIOTHORACIC SURGERY CONSULTATION REPORT  Referring Provider is [name], MD PCP is [name], MD  Reason for Consultation: Aortic stenosis with severe left ventricular dysfunction  HPI:  The patient is an 82 year old gentleman from [place], [place] with a history of hypertension, hyperlipidemia, type 2 DM, PAF on Eliquis, coronary artery disease, s/p CABG x 3 in 2002 and known moderate to severe aortic stenosis. The patient's primary cardiologist is Dr. [name] in [place]. I have interviewed the patient and his son and daughter and have reviewed the records available in the paper chart from [facility]. He reports his problems starting in May 2016 when he was diagnosed with new onset atrial fibrillation. About that time he began having some exertional fatigue, shortness of breath and episodes of dizziness. 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 He was treated with antiarrhythmic drugs and Eliquis and eventually underwent DCCV. He underwent an echo sometime last summer that reportedly showed moderate to severe AS with a mean AV gradient of 28 mm Hg and a peak of 52 mm Hg with an AVA of 1 cm2. The EF at that time was 45-50%. From the cardiology notes it was felt that his symptoms were likely due to the aortic stenosis but he was not felt to be a candidate for TAVR since his mean gradient was only 28 mm Hg. His symptoms of dizziness and exertional fatigue/SOB continued to progress and he also noted some episodes of intermittent chest pain. Reading through the notes from [facility] it sounds like another echo was done around November 2016 which again showed moderate to severe AS with a mean AV gradient of 21 mm Hg and a peak of 44 mm Hg with an AVA of 0.93 cm2. His symptoms were still felt to be due to his AS but he was still not felt to be a TAVR candidate because his peak velocity  was only 3.3 m/sec. A cath was done in December to see if he had recurrent coronary or graft stenoses for consideration of open surgical AVR and CABG. The cath report shows a mean AV gradient of 19 mm Hg with a calculated AVA of 1.2 cm2. LVEDP was 18. CI was 2.3 by thermodilution and 2.8 by Fick. Coronary angiography showed severe diffuse coronary disease with patent LIMA to the LAD, patent SVG's to the LCX and Diagonal. The LV showed normal wall motion with an EF of 50%.  He recently presented to [facility] with chest pain and was noted to have a mildly elevated troponin of 0.06. An attempt was made to transfer him to [facility] in [place] but there were no beds and he eventually was transferred to [facility] for further evaluation. His Troponin has remained 0.06 and ECG showed sinus rhythm with non-specified intraventricular block. An echo today showed an LVEF of 15-20% with diffuse hypokinesis, grade 3 diastolic dysfunction. The aortic valve is trileaflet and severely thickened and calcified with severely reduced cusp separation consistent with severe AS. The mean gradient was 14 mm Hg with a DI of 0.26. There is moderate MR with a severely dilated LA. The RV appeared normal.  His son reports progressive exertional fatigue and shortness of breath. He says that his father frequently gets dizzy and looks like he is going to pass out when he stands up. The patient says that he does ok walking  around in the house but has been sedentary recently. He can't do any strenuous activity. He has poor appetite and has lost 25 lbs over the past year. He has had bilateral ankle edema.   Past Medical History  Diagnosis Date  . CAD (coronary artery disease)     Multivessel status post CABG 2002  . Essential hypertension   . Depression   . Type 2 diabetes mellitus (HCC)   . PAF (paroxysmal atrial fibrillation) (HCC)     On Sotalol (previously Eliquis - stopped 02/2015)  . Aortic stenosis     Moderate to  severe May 2016  . Vitamin D deficiency   . Diabetic nephropathy (HCC)   . BPH (benign prostatic hyperplasia)   . GERD (gastroesophageal reflux disease)   . Chronic systolic heart failure (HCC)     LVEF 45-50% May 2016  . Medication intolerance     Amiodarone - nausea and emesis  . History of fall     Right wrist fracture and facial contusion 02/2015, possibly associated with orthostasis  . Iron deficiency anemia     Past Surgical History  Procedure Laterality Date  . Cataract surgery Bilateral   . Coronary artery bypass graft  [facility]  . Adenoidectomy, tonsillectomy and myringotomy with tube placement      Family History  Problem Relation Age of Onset  . Heart attack Father   . Hypertension Father   . Diabetes Mellitus II Brother     Social History   Social History  . Marital Status: Married    Spouse Name: N/A  . Number of Children: 1  . Years of Education: N/A   Occupational History  . Not on file.   Social History Main Topics  . Smoking status: Former Smoker    Types: Cigarettes    Quit date: 03/09/1959  . Smokeless tobacco: Not on file  . Alcohol Use: No  . Drug Use: No  . Sexual Activity: Not on file   Other Topics Concern  . Not on file   Social History Narrative    Current Facility-Administered Medications  Medication Dose Route Frequency Provider Last Rate Last Dose  . acetaminophen (TYLENOL) tablet 650 mg  650 mg Oral Q4H PRN Ellsworth Lennox, PA      . amLODipine (NORVASC) tablet 5 mg  5 mg Oral Daily Bhavinkumar Bhagat, PA   5 mg at 04/09/15 0854  . antiseptic oral rinse (CPC / CETYLPYRIDINIUM CHLORIDE 0.05%) solution 7 mL  7 mL Mouth Rinse BID Lennette Bihari, MD   7 mL at 04/09/15 1000  . aspirin EC tablet 81 mg  81 mg Oral Daily Ellsworth Lennox, [place]   81 mg at 04/09/15 0854  . atorvastatin (LIPITOR) tablet 40 mg  40 mg Oral q1800 Ellsworth Lennox, [place]   40 mg at 04/09/15 1737  . DULoxetine (CYMBALTA) DR  capsule 60 mg  60 mg Oral Daily Ellsworth Lennox, [place]   60 mg at 04/09/15 0854  . feeding supplement (ENSURE ENLIVE) (ENSURE ENLIVE) liquid 237 mL  237 mL Oral BID BM Lennette Bihari, MD   237 mL at 04/09/15 1000  . feeding supplement (GLUCERNA SHAKE) (GLUCERNA SHAKE) liquid 237 mL  237 mL Oral TID BM Idell Pickles, RD   237 mL at 04/09/15 2000  . furosemide (LASIX) tablet 20 mg  20 mg Oral Daily Ellsworth Lennox, [place]   20 mg at 04/09/15 0854  .  heparin ADULT infusion 100 units/mL (25000 units/250 mL)  800 Units/hr Intravenous Continuous Signe Colt, RPH 8 mL/hr at 04/09/15 1800 800 Units/hr at 04/09/15 1800  . hydrALAZINE (APRESOLINE) injection 5 mg  5 mg Intravenous Q8H PRN Ellsworth Lennox, [place]   5 mg at 04/07/15 2107  . insulin aspart (novoLOG) injection 0-15 Units  0-15 Units Subcutaneous TID [facility] Ellsworth Lennox, [place]   5 Units at 04/09/15 1130  . isosorbide mononitrate (IMDUR) 24 hr tablet 30 mg  30 mg Oral Daily Bhavinkumar Bhagat, PA   30 mg at 04/09/15 0854  . nitroGLYCERIN (NITROSTAT) SL tablet 0.4 mg  0.4 mg Sublingual Q5 Min x 3 PRN Ellsworth Lennox, PA   0.4 mg at 04/08/15 0036  . ondansetron (ZOFRAN) injection 4 mg  4 mg Intravenous Q6H PRN Ellsworth Lennox, PA      . pantoprazole (PROTONIX) EC tablet 40 mg  40 mg Oral Daily Ellsworth Lennox, [place]   40 mg at 04/09/15 0853  . sotalol (BETAPACE) tablet 120 mg  120 mg Oral Daily Ellsworth Lennox, [place]   120 mg at 04/09/15 1125    Allergies  Allergen Reactions  . Penicillins Rash    Has patient had a PCN reaction causing immediate rash, facial/tongue/throat swelling, SOB or lightheadedness with hypotension: Yes Has patient had a PCN reaction causing severe rash involving mucus membranes or skin necrosis: No Has patient had a PCN reaction that required hospitalization No Has patient had a PCN reaction occurring within the last 10 years: No If all of the above answers are "NO", then may proceed with Cephalosporin use.        Review of Systems:   General:  decreased appetite, poor energy, no weight gain, 25 lb weight loss, no fever  Cardiac:  intermittent chest pain with exertion,  o chest pain at rest, moderate SOB with moderate exertion, no resting SOB, no PND, no orthopnea, has palpitations,  arrhythmia,  atrial fibrillation, bilateral LE edema, frequent dizzy spells, near syncope  Respiratory:  has shortness of breath, no home oxygen, no productive cough, no dry cough, no bronchitis, no wheezing, no hemoptysis, no asthma, no pain with inspiration or cough, no sleep apnea, no CPAP at night  GI:   no difficulty swallowing, no reflux, no frequent heartburn, no hiatal hernia, no abdominal pain, no constipation, no diarrhea, no hematochezia, no hematemesis, no melena  GU:   no dysuria,  no frequency, no urinary tract infection, no hematuria, has enlarged prostate, no kidney stones, no kidney disease  Vascular:  no pain suggestive of claudication, no pain in feet, no leg cramps, no varicose veins, no DVT, no non-healing foot ulcer  Neuro:   no stroke, no TIA's, no seizures, no headaches, notemporary blindness one eye,  no slurred speech, no peripheral neuropathy, no chronic pain, no instability of gait, no memory/cognitive dysfunction  Musculoskeletal: no arthritis, no joint swelling, no myalgias, no difficulty walking, no mobility   Skin:   no rash, no itching, no skin infections, no pressure sores or ulcerations  Psych:   no anxiety, no depression, no nervousness, no unusual recent stress  Eyes:   no blurry vision, no floaters, no recent vision changes,  wears glasses or contacts  ENT:   no hearing loss, no loose or painful teeth, wears dentures,   Hematologic:  no easy bruising, no abnormal bleeding, no clotting disorder, no frequent epistaxis  Endocrine:  has diabetes, does  check CBG's at home  Physical Exam:   BP 138/76 mmHg  Pulse 59  Temp(Src) 98.1 F (36.7 C) (Oral)  Resp 14  Ht 6'  1" (1.854 m)  Wt 67.495 kg (148 lb 12.8 oz)  BMI 19.64 kg/m2  SpO2 94%  General:  Elderly, frail-appearing  HEENT:  Unremarkable , NCAT, PERLA, EOMI  Neck:   no JVD, no bruits, no adenopathy or thyromegaly  Chest:   clear to auscultation, symmetrical breath sounds, no wheezes, no rhonchi   CV:   RRR, grade II/VI crescendo/decrescendo murmur heard best at LSB,  no diastolic murmur  Abdomen:  soft, non-tender, no masses or organomegaly  Extremities:  warm, well-perfused, pulses palpable in feet, mild LE edema  Rectal/GU  Deferred  Neuro:   Grossly non-focal and symmetrical throughout  Skin:   Clean and dry, no rashes, no breakdown   Diagnostic Tests:          *[place]*         *[facility]*            [address]            [place], [place] [address]              [phone]  ------------------------------------------------------------------- Transthoracic Echocardiography  Patient:  Gary Beck, Gary Beck MR #:    [id number] Study Date: 04/09/2015 Gender:   M Age:    86 Height:   185.4 cm Weight:   67.1 kg BSA:    1.85 m^2 Pt. Status: Room:    3W15C  ADMITTING  [name], M.D. ATTENDING  [name], M.D. PERFORMING  [facility], Inpatient SONOGRAPHER Thurman Coyer Teressa Lower, [place] M REFERRING  [place] Ouch [place] M  cc:  ------------------------------------------------------------------- LV EF: 15% -  20%  ------------------------------------------------------------------- Indications:   Chest pain 786.51.  ------------------------------------------------------------------- History:  PMH:  Atrial fibrillation. Coronary artery disease. Aortic valve disease. Risk factors: Former tobacco use. Hypertension. Diabetes mellitus.  ------------------------------------------------------------------- Study Conclusions  - Left ventricle: The  cavity size was moderately dilated. Systolic function was severely reduced. The estimated ejection fraction was in the range of 15% to 20%. Diffuse hypokinesis. Doppler parameters are consistent with a reversible restrictive pattern, indicative of decreased left ventricular diastolic compliance and/or increased left atrial pressure (grade 3 diastolic dysfunction). - Aortic valve: Trileaflet; severely thickened, severely calcified leaflets. Cusp separation was severely reduced. Findings suggestive of severe stenosis (low gradient, low output). There was mild regurgitation. Peak velocity (S): 270 cm/s. Mean gradient (S): 14 mm Hg. Valve area (VTI): 0.99 cm^2. Valve area (Vmax): 1 cm^2. Valve area (Vmean): 0.87 cm^2. - Mitral valve: Calcified annulus. There was moderate regurgitation. - Left atrium: The atrium was severely dilated. - Right atrium: The atrium was mildly dilated.  Transthoracic echocardiography. M-mode, complete 2D, spectral Doppler, and color Doppler. Birthdate: Patient birthdate: [birth year]. Age: Patient is 82 yr old. Sex: Gender: male. BMI: 19.5 kg/m^2. Blood pressure:   140/65 Patient status: Inpatient. Study date: Study date: 04/09/2015. Study time: 10:42 AM. Location: Echo laboratory.  -------------------------------------------------------------------  ------------------------------------------------------------------- Left ventricle: The cavity size was moderately dilated. Systolic function was severely reduced. The estimated ejection fraction was in the range of 15% to 20%. Diffuse hypokinesis. Doppler parameters are consistent with a reversible restrictive pattern, indicative of decreased left ventricular diastolic compliance and/or increased left atrial pressure (grade 3 diastolic dysfunction).  ------------------------------------------------------------------- Aortic valve:  Trileaflet; severely thickened, severely  calcified leaflets. Cusp separation was severely reduced. Doppler: Findings suggestive of severe stenosis (low gradient, low output).  There was mild regurgitation.  VTI ratio of LVOT to aortic valve: 0.26. Valve area (VTI): 0.99 cm^2. Indexed valve area (VTI): 0.54 cm^2/m^2. Peak velocity ratio of LVOT to aortic valve: 0.26. Valve area (Vmax): 1 cm^2. Indexed valve area (Vmax): 0.54 cm^2/m^2. Mean velocity ratio of LVOT to aortic valve: 0.23. Valve area (Vmean): 0.87 cm^2. Indexed valve area (Vmean): 0.47 cm^2/m^2.  Mean gradient (S): 14 mm Hg. Peak gradient (S): 29 mm Hg.  ------------------------------------------------------------------- Aorta: Aortic root: The aortic root was normal in size.  ------------------------------------------------------------------- Mitral valve:  Calcified annulus. Mobility was not restricted. Doppler: Transvalvular velocity was within the normal range. There was no evidence for stenosis. There was moderate regurgitation. Peak gradient (D): 4 mm Hg.  ------------------------------------------------------------------- Left atrium: The atrium was severely dilated.  ------------------------------------------------------------------- Right ventricle: The cavity size was normal. Wall thickness was normal. Systolic function was normal.  ------------------------------------------------------------------- Pulmonic valve:  Poorly visualized. Structurally normal valve. Cusp separation was normal. Doppler: Transvalvular velocity was within the normal range. There was no evidence for stenosis. There was no regurgitation.  ------------------------------------------------------------------- Tricuspid valve:  Structurally normal valve.  Doppler: Transvalvular velocity was within the normal range. There was no regurgitation.  ------------------------------------------------------------------- Pulmonary artery:  The main pulmonary artery was  normal-sized. Systolic pressure was within the normal range.  ------------------------------------------------------------------- Right atrium: The atrium was mildly dilated.  ------------------------------------------------------------------- Pericardium: There was no pericardial effusion.  ------------------------------------------------------------------- Systemic veins: Inferior vena cava: The vessel was normal in size.  ------------------------------------------------------------------- Measurements  Left ventricle              Value     Reference LV ID, ED, PLAX chordal      (H)   62  mm    43 - 52 LV ID, ES, PLAX chordal      (H)   46.4 mm    23 - 38 LV fx shortening, PLAX chordal  (L)   25  %    >=29 LV PW thickness, ED            11.3 mm    --------- IVS/LV PW ratio, ED            0.7      <=1.3 Stroke volume, 2D             62  ml    --------- Stroke volume/bsa, 2D           34  ml/m^2  --------- LV ejection fraction, 1-p A4C       28  %    --------- LV end-diastolic volume, 2-p       152  ml    --------- LV end-systolic volume, 2-p        104  ml    --------- LV ejection fraction, 2-p         32  %    --------- Stroke volume, 2-p            48  ml    --------- LV end-diastolic volume/bsa, 2-p     82  ml/m^2  --------- LV end-systolic volume/bsa, 2-p      56  ml/m^2  --------- Stroke volume/bsa, 2-p          26  ml/m^2  --------- LV e&', lateral              6.08 cm/s   --------- LV E/e&', lateral             16.45     --------- LV e&',  medial               3.75 cm/s   --------- LV E/e&', medial              26.67     --------- LV e&', average              4.92  cm/s   --------- LV E/e&', average             20.35     ---------  Ventricular septum            Value     Reference IVS thickness, ED             7.96 mm    ---------  LVOT                   Value     Reference LVOT ID, S                22  mm    --------- LVOT area                 3.8  cm^2   --------- LVOT peak velocity, S           70.9 cm/s   --------- LVOT mean velocity, S           37.6 cm/s   --------- LVOT VTI, S                16.4 cm    --------- LVOT peak gradient, S           2   mm Hg  ---------  Aortic valve               Value     Reference Aortic valve peak velocity, S       270  cm/s   --------- Aortic valve mean velocity, S       165  cm/s   --------- Aortic valve VTI, S            62.8 cm    --------- Aortic mean gradient, S          14  mm Hg  --------- Aortic peak gradient, S          29  mm Hg  --------- VTI ratio, LVOT/AV            0.26      --------- Aortic valve area, VTI          0.99 cm^2   --------- Aortic valve area/bsa, VTI        0.54 cm^2/m^2 --------- Velocity ratio, peak, LVOT/AV       0.26      --------- Aortic valve area, peak velocity     1   cm^2   --------- Aortic valve area/bsa, peak        0.54 cm^2/m^2 --------- velocity Velocity ratio, mean, LVOT/AV       0.23      --------- Aortic valve area, mean velocity     0.87 cm^2   --------- Aortic valve area/bsa, mean        0.47 cm^2/m^2 --------- velocity Aortic regurg pressure half-time     516  ms    ---------  Aorta                   Value     Reference Aortic root ID, ED  29  mm    ---------  Left atrium                Value     Reference LA ID, A-P, ES              48  mm    --------- LA ID/bsa, A-P          (H)   2.6  cm/m^2  <=2.2 LA volume, S               116  ml    --------- LA volume/bsa, S             62.8 ml/m^2  --------- LA volume, ES, 1-p A4C          100  ml    --------- LA volume/bsa, ES, 1-p A4C        54.1 ml/m^2  --------- LA volume, ES, 1-p A2C          130  ml    --------- LA volume/bsa, ES, 1-p A2C        70.4 ml/m^2  ---------  Mitral valve               Value     Reference Mitral E-wave peak velocity        100  cm/s   --------- Mitral A-wave peak velocity        52  cm/s   --------- Mitral deceleration time     (H)   239  ms    150 - 230 Mitral peak gradient, D          4   mm Hg  --------- Mitral E/A ratio, peak          1.9      ---------  Right ventricle              Value     Reference RV s&', lateral, S             8.1  cm/s   ---------  Legend: (L) and (H) mark values outside specified reference range.  ------------------------------------------------------------------- Prepared and Electronically Authenticated by  Donato Schultz, M.D. 2017-02-01T13:13:32   Impression:  This 82 year old gentleman has had a gradual decline since last May that is probably due to low gradient severe aortic stenosis. I think he has stage D symptomatic low EF, low gradient severe aortic stenosis with NYHA class III congestive heart failure symptoms. His echocardiograms since May 2016 have shown a progressive decline in his LV systolic function with a corresponding decline in the AV gradient. His EF was reportedly 50% in November/December and is now 20-25%. I  have personally reviewed his echo and the aortic valve is severely calcified and immobile and looks like a severely stenotic valve. His recent cardiac cath reportedly showed patent grafts with adequate perfusion to all territories so ischemia does not appear to be the cause of his LV deterioration. His functional status has declined significantly with poor appetite and significant weight loss although he still gets up and moves around according to his family. He says he just wants to feel better. I don't ithink he is a candidate for redo sternotomy for open surgical AVR at 86 with poor LV function. TAVR may be a reasonable alternative although I am not sure if replacing his aortic will result in improved LV function and continued functional independence.   The patient and his family were counseled at length regarding treatment alternatives  for management of severe symptomatic aortic stenosis. Alternative approaches such as conventional aortic valve replacement, transcatheter aortic valve replacement, and palliative medical therapy were compared and contrasted at length. The risks associated with conventional surgical aortic valve replacement were been discussed in detail, as were expectations for post-operative convalescence. Long-term prognosis with medical therapy was discussed.  I will ask Dr. Excell Seltzer to evaluate him tomorrow to help with the decision whether to offer him TAVR. I am not sure if it would be worthwhile doing a dobutamine stress echo to further evaluate the significance of his AS and to see if his EF improves. He and his family are anxious and confused by his course over the past 9 months and his recent decline in LV function. This is made worse because his prior care has always been in [facility] by Dr. [name]. I spent 90 minutes discussing his symptoms, diagnostic studies and treatmentalternatives. All of their questions have been answered. We will try to get a copy of his cath films and  prior echos at [facility]. He may ultimately decide to return to his primary cardiologist for further treatment of his aortic stenosis which would probably be ok since he does not have acute heart failure symptoms at this time.         Plan:  I will ask Dr. Excell Seltzer to evaluate him for consideration of TAVR and then we will decide whether to proceed with further workup here or let him go home to follow up with his primary cardiologist.    Gary Borne, MD 04/09/2015

## 2015-04-10 ENCOUNTER — Inpatient Hospital Stay (HOSPITAL_COMMUNITY): Payer: Medicare (Managed Care)

## 2015-04-10 ENCOUNTER — Other Ambulatory Visit: Payer: Self-pay | Admitting: *Deleted

## 2015-04-10 DIAGNOSIS — I350 Nonrheumatic aortic (valve) stenosis: Secondary | ICD-10-CM

## 2015-04-10 DIAGNOSIS — E119 Type 2 diabetes mellitus without complications: Secondary | ICD-10-CM

## 2015-04-10 DIAGNOSIS — I480 Paroxysmal atrial fibrillation: Secondary | ICD-10-CM | POA: Diagnosis present

## 2015-04-10 LAB — CBC
HCT: 37.5 % — ABNORMAL LOW (ref 39.0–52.0)
Hemoglobin: 12.4 g/dL — ABNORMAL LOW (ref 13.0–17.0)
MCH: 30.5 pg (ref 26.0–34.0)
MCHC: 33.1 g/dL (ref 30.0–36.0)
MCV: 92.1 fL (ref 78.0–100.0)
Platelets: 220 K/uL (ref 150–400)
RBC: 4.07 MIL/uL — ABNORMAL LOW (ref 4.22–5.81)
RDW: 14.5 % (ref 11.5–15.5)
WBC: 5.4 K/uL (ref 4.0–10.5)

## 2015-04-10 LAB — HEPARIN LEVEL (UNFRACTIONATED)
HEPARIN UNFRACTIONATED: 0.39 [IU]/mL (ref 0.30–0.70)
Heparin Unfractionated: 0.19 [IU]/mL — ABNORMAL LOW (ref 0.30–0.70)
Heparin Unfractionated: 0.46 IU/mL (ref 0.30–0.70)

## 2015-04-10 LAB — GLUCOSE, CAPILLARY
Glucose-Capillary: 189 mg/dL — ABNORMAL HIGH (ref 65–99)
Glucose-Capillary: 181 mg/dL — ABNORMAL HIGH (ref 65–99)
Glucose-Capillary: 340 mg/dL — ABNORMAL HIGH (ref 65–99)
Glucose-Capillary: 147 mg/dL — ABNORMAL HIGH (ref 65–99)

## 2015-04-10 MED ORDER — ATROPINE SULFATE 0.4 MG/ML IJ SOLN
INTRAMUSCULAR | Status: AC
  Filled 2015-04-10: qty 1

## 2015-04-10 MED ORDER — DOBUTAMINE INFUSION FOR EP/ECHO/NUC (1000 MCG/ML)
0.0000 ug/kg/min | INTRAVENOUS | Status: DC
  Administered 2015-04-10: 0 ug/kg/min via INTRAVENOUS
  Administered 2015-04-10: 5 ug/kg/min via INTRAVENOUS
  Administered 2015-04-10: 10 ug/kg/min via INTRAVENOUS
  Filled 2015-04-10: qty 250

## 2015-04-10 MED ORDER — DOBUTAMINE INFUSION FOR EP/ECHO/NUC (1000 MCG/ML)
INTRAVENOUS | Status: AC
  Filled 2015-04-10: qty 250

## 2015-04-10 MED ORDER — METOPROLOL TARTRATE 1 MG/ML IV SOLN
INTRAVENOUS | Status: AC
  Filled 2015-04-10: qty 5

## 2015-04-10 NOTE — Consult Note (Signed)
CARDIOLOGY CONSULT NOTE  Patient ID: Gary Beck, MRN: [id number], DOB/AGE: 10/19/1928 82 y.o. Admit date: 04/07/2015 Date of Consult: 04/10/2015  Primary Physician: [name], MD Primary Cardiologist: Dr [name] [facility]) Referring Physician: Dr Tresa Endo  Chief Complaint: Shortness of Breath/Weakness Reason for Consultation: Aortic Stenosis  HPI: 82 year old gentleman who transferred here for further evaluation of chest pain and mildly elevated troponin of 0.06. He has been followed regularly at [facility] in [place]. The patient has long-standing cardiac history and he underwent coronary bypass surgery in 2002. He has been followed for aortic stenosis, paroxysmal atrial fibrillation, hypertension, hyperlipidemia, and type 2 diabetes. The patient has been physically active and in fact he was still working until December 2016. Since that time, he reports a progressive course of weakness and exercise intolerance. He sustained a fall and had a significant amount of bruising/bleeding. His chronic oral anticoagulant was stopped at that time. He has undergone recent cardiac catheterization for further evaluation of his aortic stenosis. This demonstrated a mean transaortic valve gradient of 19 mmHg. His aortic valve area calculated to 0.93 cm. All of his bypass grafts were patent and there were no areas for revascularization. Ongoing medical therapy was recommended. Review of records show an echocardiogram from May 2016 with an estimated LVEF of 45-50%, severe thickening and limited excursion of the aortic valve leaflets, and peak and mean gradients of 52 and 28 mmHg with an estimated aortic valve area of 1.0 cm and peak instantaneous transvalvular velocity of 3.6 m/s with a dimensionless index of 0.25.  The multidisciplinary valve team is now asked to evaluate this patient with marked change in LV function, as his echocardiogram yesterday demonstrated an LVEF of 15-20%. The patient  was evaluated yesterday by Dr. Laneta Simmers, and further treatment options are being considered at this time. The patient is interviewed alone this morning, but his family members will be here a little later today. He reports symptoms of diminished appetite and 25 pound weight loss over the past year. He complains of exertional fatigue and shortness of breath. He has dizzy spells and has had near syncope. He did have chest pain prompting this hospital admission, but has been pain-free since arrival here.  Medical History:  Past Medical History  Diagnosis Date  . CAD (coronary artery disease)     Multivessel status post CABG 2002  . Essential hypertension   . Depression   . Type 2 diabetes mellitus (HCC)   . PAF (paroxysmal atrial fibrillation) (HCC)     On Sotalol (previously Eliquis - stopped 02/2015)  . Aortic stenosis     Moderate to severe May 2016  . Vitamin D deficiency   . Diabetic nephropathy (HCC)   . BPH (benign prostatic hyperplasia)   . GERD (gastroesophageal reflux disease)   . Chronic systolic heart failure (HCC)     LVEF 45-50% May 2016  . Medication intolerance     Amiodarone - nausea and emesis  . History of fall     Right wrist fracture and facial contusion 02/2015, possibly associated with orthostasis  . Iron deficiency anemia       Surgical History:  Past Surgical History  Procedure Laterality Date  . Cataract surgery Bilateral   . Coronary artery bypass graft  2002     [facility]  . Adenoidectomy, tonsillectomy and myringotomy with tube placement       Home Meds: Prior to Admission medications   Medication Sig Start Date End Date Taking? Authorizing Provider  amLODipine (NORVASC) 5 MG tablet Take 1 tablet (5 mg total) by mouth daily. 04/09/15   Bhavinkumar Bhagat, PA  aspirin EC 81 MG tablet Take 81 mg by mouth daily.   Yes Historical Provider, MD  atorvastatin (LIPITOR) 40 MG tablet Take 40 mg by mouth daily.   Yes Historical Provider, MD    DULoxetine (CYMBALTA) 60 MG capsule Take 120 mg by mouth daily.   Yes Historical Provider, MD  furosemide (LASIX) 20 MG tablet Take 20 mg by mouth daily as needed for fluid or edema.   Yes Historical Provider, MD  glimepiride (AMARYL) 4 MG tablet Take 4 mg by mouth daily with breakfast.   Yes Historical Provider, MD  isosorbide mononitrate (IMDUR) 30 MG 24 hr tablet Take 1 tablet (30 mg total) by mouth daily. 04/09/15   Bhavinkumar Bhagat, PA  metFORMIN (GLUCOPHAGE) 1000 MG tablet Take 1,000 mg by mouth 2 (two) times daily with a meal.   Yes Historical Provider, MD  nitroGLYCERIN (NITROSTAT) 0.4 MG SL tablet Place 1 tablet (0.4 mg total) under the tongue every 5 (five) minutes x 3 doses as needed for chest pain. 04/09/15   Bhavinkumar Bhagat, PA  omeprazole (PRILOSEC) 20 MG capsule Take 20 mg by mouth daily.   Yes Historical Provider, MD  sotalol (BETAPACE) 120 MG tablet Take 120 mg by mouth daily.   Yes Historical Provider, MD    Inpatient Medications:  . amLODipine  5 mg Oral Daily  . antiseptic oral rinse  7 mL Mouth Rinse BID  . aspirin EC  81 mg Oral Daily  . atorvastatin  40 mg Oral q1800  . DULoxetine  60 mg Oral Daily  . feeding supplement (ENSURE ENLIVE)  237 mL Oral BID BM  . feeding supplement (GLUCERNA SHAKE)  237 mL Oral TID BM  . furosemide  20 mg Oral Daily  . insulin aspart  0-15 Units Subcutaneous TID WC  . isosorbide mononitrate  30 mg Oral Daily  . pantoprazole  40 mg Oral Daily  . sotalol  120 mg Oral Daily   . heparin 900 Units/hr (04/10/15 0103)    Allergies:  Allergies  Allergen Reactions  . Penicillins Rash    Has patient had a PCN reaction causing immediate rash, facial/tongue/throat swelling, SOB or lightheadedness with hypotension: Yes Has patient had a PCN reaction causing severe rash involving mucus membranes or skin necrosis: No Has patient had a PCN reaction that required hospitalization No Has patient had a PCN reaction occurring within the last 10  years: No If all of the above answers are "NO", then may proceed with Cephalosporin use.     Social History   Social History  . Marital Status: Married    Spouse Name: N/A  . Number of Children: N/A  . Years of Education: N/A   Occupational History  . Not on file.   Social History Main Topics  . Smoking status: Former Smoker    Types: Cigarettes    Quit date: 03/09/1959  . Smokeless tobacco: Not on file  . Alcohol Use: No  . Drug Use: No  . Sexual Activity: Not on file   Other Topics Concern  . Not on file   Social History Narrative     Family History  Problem Relation Age of Onset  . Heart attack Father   . Hypertension Father   . Diabetes Mellitus II Brother      Review of Systems: General: negative for chills, fever, night sweats.  Positive for  weakness and weight loss  ENT: negative for rhinorrhea or epistaxis Cardiovascular: See history of present illness Dermatological: negative for rash Respiratory: negative for cough or wheezing, positive for shortness of breath GI: negative for nausea, vomiting, diarrhea, bright red blood per rectum, melena, or hematemesis, positive for diminished appetite GU: no hematuria, urgency, or frequency Neurologic: negative for visual changes, syncope, headache, or dizziness Heme: Positive for easy bruising Endo: negative for excessive thirst, thyroid disorder, or flushing Musculoskeletal: negative for joint pain or swelling, negative for myalgias  All other systems reviewed and are otherwise negative except as noted above.  Physical Exam: Blood pressure 137/78, pulse 62, temperature 98.3 F (36.8 C), temperature source Oral, resp. rate 16, height  (1.854 m), weight 68.04 kg (150 lb), SpO2 94 %. Pt is alert and oriented, pleasant elderly, thin male, in no distress HEENT: normal Neck: JVP normal. Carotid upstrokes normal with bilateral bruits. No thyromegaly. Lungs: equal expansion, clear bilaterally CV: Apex is  discrete and nondisplaced, RRR distant heart sounds but grade 3/6 harsh systolic murmur. I am unable to hear the A2 component of the second heart sound. Abd: soft, NT, +BS, no bruit, no hepatosplenomegaly Back: no CVA tenderness Ext: no C/C/E        DP/PT pulses intact and = Skin: warm and dry without rash Neuro: CNII-XII intact             Strength intact = bilaterally    Labs:  Recent Labs  04/07/15 2138 04/08/15 0231 04/08/15 0941  TROPONINI 0.06* 0.06* 0.05*   Lab Results  Component Value Date   WBC 5.4 04/10/2015   HGB 12.4* 04/10/2015   HCT 37.5* 04/10/2015   MCV 92.1 04/10/2015   PLT 220 04/10/2015    Recent Labs Lab 04/09/15 0604  NA 141  K 3.8  CL 100*  CO2 31  BUN 13  CREATININE 0.91  CALCIUM 8.1*  GLUCOSE 153*   Lab Results  Component Value Date   CHOL 142 04/08/2015   HDL 46 04/08/2015   LDLCALC 82 04/08/2015   TRIG 68 04/08/2015   No results found for: DDIMER  Radiology/Studies:  No results found.  Cardiac Studies: 2D Echo: Study Conclusions  - Left ventricle: The cavity size was moderately dilated. Systolic function was severely reduced. The estimated ejection fraction was in the range of 15% to 20%. Diffuse hypokinesis. Doppler parameters are consistent with a reversible restrictive pattern, indicative of decreased left ventricular diastolic compliance and/or increased left atrial pressure (grade 3 diastolic dysfunction). - Aortic valve: Trileaflet; severely thickened, severely calcified leaflets. Cusp separation was severely reduced. Findings suggestive of severe stenosis (low gradient, low output). There was mild regurgitation. Peak velocity (S): 270 cm/s. Mean gradient (S): 14 mm Hg. Valve area (VTI): 0.99 cm^2. Valve area (Vmax): 1 cm^2. Valve area (Vmean): 0.87 cm^2. - Mitral valve: Calcified annulus. There was moderate regurgitation. - Left atrium: The atrium was severely dilated. - Right atrium: The  atrium was mildly dilated.  STS Risk Calculator: Procedure: AV Replacement  Risk of Mortality: 6.646%  Morbidity or Mortality: 30.118%  Long Length of Stay: 14.496%  Short Length of Stay: 13.615%  Permanent Stroke: 2.128%  Prolonged Ventilation: 21.775%  DSW Infection: 0.362%  Renal Failure: 8.338%  Reoperation: 10.739%   ASSESSMENT AND PLAN:  82 year old gentleman with stage D, symptomatic low gradient low ejection fraction severe aortic stenosis. I have personally reviewed his echo images which demonstrate very severe LV systolic dysfunction and a heavily calcified aortic valve with  severe restriction in leaflet mobility. Outside records are reviewed which include a cardiac catheterization study demonstrating patency of his bypass grafts. His transaortic velocities have decreased over the past year, but this appears to be related to worsening LV systolic function. There is no other clear explanation for why his LV function has deteriorated so markedly and rapidly.  I have reviewed the natural history of aortic stenosis with the patient. We have discussed the limitations of medical therapy and the poor prognosis associated with symptomatic aortic stenosis. We have reviewed potential treatment options, including palliative medical therapy, conventional surgical aortic valve replacement, and transcatheter aortic valve replacement. We discussed treatment options in the context of this patient's specific comorbid medical conditions.   I agree with Dr Laneta Simmers that this elderly gentleman, who is functional but has many co-morbidities, would not be a candidate for redo conventional surgery. However, it might be reasonable to consider TAVR for treatment of his aortic stenosis. I have recommended a dobutamine stress echocardiogram to evaluate Myocardial reserve and better assess for true low-gradient severe AS. Pending those results, will counsel the patient as to whether TAVR or palliative medical therapy  may be more appropriate.   He will discuss logistical options with his family regarding whether he prefers further evaluation and treatment here or in [place]. If we proceed with TAVR evaluation, he will require CT studies of the heart and peripheral vessels as well as a second surgical opinion. All questions are answered. Further plans pending dobutamine echo results. Case discussed with Dr Tresa Endo and Dr Laneta Simmers at length.  Enzo Bi MD, [facility] 04/10/2015, 9:33 AM

## 2015-04-10 NOTE — Plan of Care (Signed)
Problem: Consults Goal: Nutrition Consult-if indicated Outcome: Progressing Pt meal intake varies, today was a "good day" per pt wife. Pt had 75% of breakfast. Refused ensure and boost but states he will drink evening dose.

## 2015-04-10 NOTE — Care Management Important Message (Signed)
Important Message  Patient Details  Name: Gary Beck MRN: [id number] Date of Birth: 09-06-1928   Medicare Important Message Given:  Yes    [name] Abena 04/10/2015, 11:11 AM

## 2015-04-10 NOTE — Progress Notes (Signed)
ANTICOAGULATION CONSULT NOTE - Follow Up Consult  Pharmacy Consult for Heparin  Indication: chest pain/ACS  Allergies  Allergen Reactions  . Penicillins Rash    Has patient had a PCN reaction causing immediate rash, facial/tongue/throat swelling, SOB or lightheadedness with hypotension: Yes Has patient had a PCN reaction causing severe rash involving mucus membranes or skin necrosis: No Has patient had a PCN reaction that required hospitalization No Has patient had a PCN reaction occurring within the last 10 years: No If all of the above answers are "NO", then may proceed with Cephalosporin use.     Patient Measurements: Height:  (185.4 cm) Weight: 150 lb (68.04 kg) IBW/kg (Calculated) : 79.9  Vital Signs: Temp: 98.3 F (36.8 C) (02/02 0500) BP: 137/78 mmHg (02/02 0500) Pulse Rate: 62 (02/02 0500)  Labs:  Recent Labs  04/07/15 2138  04/08/15 0231  04/08/15 0941  04/09/15 0604 04/09/15 1647 04/10/15 0023 04/10/15 0024 04/10/15 0839  HGB  --   --   --   --  13.2  --   --   --   --  12.4*  --   HCT  --   --   --   --  40.0  --   --   --   --  37.5*  --   PLT  --   --   --   --  235  --   --   --   --  220  --   LABPROT  --   --   --   --  14.3  --   --   --   --   --   --   INR  --   --   --   --  1.09  --   --   --   --   --   --   HEPARINUNFRC  --   < >  --   < >  --   < > 0.73* <0.10* 0.19*  --  0.46  CREATININE  --   --   --   --  0.78  --  0.91  --   --   --   --   TROPONINI 0.06*  --  0.06*  --  0.05*  --   --   --   --   --   --   < > = values in this interval not displayed.  Estimated Creatinine Clearance: 58.1 mL/min (by C-G formula based on Cr of 0.91).   Assessment: Heparin level is therapeutic at 0.46 on 900 units/hr  Goal of Therapy:  Heparin level 0.3-0.7 units/ml Monitor platelets by anticoagulation protocol: Yes   Plan:  - Continue heparin at 900 units/hr - follow up 8 hr Heparin level - daily CBC, daily heparin level  Thank you for  allowing [place] to participate in this patients care. Signe Colt, PharmD Pager: [phone]  04/10/2015,9:59 AM

## 2015-04-10 NOTE — Progress Notes (Signed)
ANTICOAGULATION CONSULT NOTE - Follow Up Consult  Pharmacy Consult for Heparin  Indication: chest pain/ACS  Allergies  Allergen Reactions  . Penicillins Rash    Has patient had a PCN reaction causing immediate rash, facial/tongue/throat swelling, SOB or lightheadedness with hypotension: Yes Has patient had a PCN reaction causing severe rash involving mucus membranes or skin necrosis: No Has patient had a PCN reaction that required hospitalization No Has patient had a PCN reaction occurring within the last 10 years: No If all of the above answers are "NO", then may proceed with Cephalosporin use.     Patient Measurements: Height:  (185.4 cm) Weight: 150 lb (68.04 kg) IBW/kg (Calculated) : 79.9  Vital Signs: Temp: 98.7 F (37.1 C) (02/02 1432) Temp Source: Oral (02/02 1432) BP: 110/57 mmHg (02/02 1432) Pulse Rate: 54 (02/02 1432)  Labs:  Recent Labs  04/07/15 2138  04/08/15 0231  04/08/15 0941  04/09/15 0604  04/10/15 0023 04/10/15 0024 04/10/15 0839 04/10/15 1628  HGB  --   --   --   --  13.2  --   --   --   --  12.4*  --   --   HCT  --   --   --   --  40.0  --   --   --   --  37.5*  --   --   PLT  --   --   --   --  235  --   --   --   --  220  --   --   LABPROT  --   --   --   --  14.3  --   --   --   --   --   --   --   INR  --   --   --   --  1.09  --   --   --   --   --   --   --   HEPARINUNFRC  --   < >  --   < >  --   < > 0.73*  < > 0.19*  --  0.46 0.39  CREATININE  --   --   --   --  0.78  --  0.91  --   --   --   --   --   TROPONINI 0.06*  --  0.06*  --  0.05*  --   --   --   --   --   --   --   < > = values in this interval not displayed.  Estimated Creatinine Clearance: 58.1 mL/min (by C-G formula based on Cr of 0.91).   Assessment: Heparin level is therapeutic at 0.39 on 900 units/hr.  No bleeding reported.   Goal of Therapy:  Heparin level 0.3-0.7 units/ml Monitor platelets by anticoagulation protocol: Yes   Plan:  - Continue heparin at  900 units/hr - daily CBC, daily heparin level  Herby Abraham, Pharm.D. [phone] 04/10/2015 6:00 PM

## 2015-04-10 NOTE — Progress Notes (Signed)
  Echocardiogram Echocardiogram Pharmacologic Stress Test has been performed.  Nolon Rod 04/10/2015, 12:28 PM

## 2015-04-10 NOTE — Progress Notes (Signed)
Patient Name: Nieko Clarin Date of Encounter: 04/10/2015   Primary Physician: [name], MD  Primary Cardiologist: Dr. Marisue Brooklyn - [facility]   Pt. profile:Haneef Rennaker is a 82 y.o. male with past medical history of CAD (s/p 3-vessel CABG in 2002), HTN, paroxysmal atrial fibrillation (not on anticoagulation since 02/2015), HLD, chronic systolic CHF (EF 16-10% by echo in May 2016), and moderate to severe aortic stenosis who presents to [facility] on 04/07/2015 as a transfer from [facility] for chest pain. (see H&P for recent evaluation of AS and fall hx)  SUBJECTIVE  Feeling well. No chest pain, sob or palpitations.   CURRENT MEDS . amLODipine  5 mg Oral Daily  . antiseptic oral rinse  7 mL Mouth Rinse BID  . aspirin EC  81 mg Oral Daily  . atorvastatin  40 mg Oral q1800  . DULoxetine  60 mg Oral Daily  . feeding supplement (ENSURE ENLIVE)  237 mL Oral BID BM  . feeding supplement (GLUCERNA SHAKE)  237 mL Oral TID BM  . furosemide  20 mg Oral Daily  . insulin aspart  0-15 Units Subcutaneous TID WC  . isosorbide mononitrate  30 mg Oral Daily  . pantoprazole  40 mg Oral Daily  . sotalol  120 mg Oral Daily    OBJECTIVE  Filed Vitals:   04/09/15 0900 04/09/15 1356 04/09/15 2023 04/10/15 0500  BP:  117/65 138/76 137/78  Pulse: 61 59  62  Temp:  97.5 F (36.4 C) 98.1 F (36.7 C) 98.3 F (36.8 C)  TempSrc:  Oral Oral   Resp: Height:      Weight:    150 lb (68.04 kg)  SpO2: 97% 95% 94% 94%    Intake/Output Summary (Last 24 hours) at 04/10/15 0827 Last data filed at 04/10/15 0800  Gross per 24 hour  Intake 1386.15 ml  Output    800 ml  Net 586.15 ml   Filed Weights   04/08/15 0500 04/09/15 0436 04/10/15 0500  Weight: 151 lb 9.6 oz (68.765 kg) 148 lb 12.8 oz (67.495 kg) 150 lb (68.04 kg)    PHYSICAL EXAM  General: Pleasant, NAD. Neuro: Alert and oriented X 3. Moves all extremities spontaneously. Psych: Normal affect. HEENT:   Normal  Neck: Supple without bruits or JVD. Lungs:  Resp regular and unlabored, CTA. Heart: RRR no s3, s4, or murmurs. Abdomen: Soft, non-tender, non-distended, BS + x 4.  Extremities: No clubbing, cyanosis or edema. DP/PT/Radials 2+ and equal bilaterally.  Accessory Clinical Findings  CBC  Recent Labs  04/08/15 0941 04/10/15 0024  WBC 5.9 5.4  HGB 13.2 12.4*  HCT 40.0 37.5*  MCV 90.3 92.1  PLT 235 220   Basic Metabolic Panel  Recent Labs  04/08/15 0941 04/09/15 0604  NA 141 141  K 3.1* 3.8  CL 100* 100*  CO2 29 31  GLUCOSE 107* 153*  BUN 10 13  CREATININE 0.78 0.91  CALCIUM 8.0* 8.1*   Liver Function Tests No results for input(s): AST, ALT, ALKPHOS, BILITOT, PROT, ALBUMIN in the last 72 hours. No results for input(s): LIPASE, AMYLASE in the last 72 hours. Cardiac Enzymes  Recent Labs  04/07/15 2138 04/08/15 0231 04/08/15 0941  TROPONINI 0.06* 0.06* 0.05*   Fasting Lipid Panel  Recent Labs  04/08/15 0231  CHOL 142  HDL 46  LDLCALC 82  TRIG 68  CHOLHDL 3.1   TELE  Sinus rhythm with PACS and PVCs  Radiology/Studies  No  results found.  ASSESSMENT AND PLAN  1. Atypical chest pain/Elevated troponin - Transfer from [facility] for further evaluation of atypical chest pain and minimally abnormal troponin I (0.036). - Troponin 0.06-->0.06-->0.05. Continued IV heparin.  - Resolved chest pain. Continue ASA, Norvasc, statin and imdur.    2. CAD - CAD, s/p 3-vessel bypass in 2002.  - Reviewed cath report 02/19/2015 -->severe diffuse coronary disease with adequate perfusion to all area of the heart except first diagonal (totally occluded) which has patent graft. Moderate AS. - See yesterday's note from detailed cath report.   3. Paroxysmal Atrial Fibrillation - Maintaining sinus rhythm with PACs and PVcs - This patients CHA2DS2-VASc Score and unadjusted Ischemic Stroke Rate (% per year) is equal to 9.7 % stroke rate/year from a score of 6  (CHF, HTN, DM, Vascular, Age (2). Was on Eliquis in 02/2015. This was discontinued due to anemia and a fall. Hgb has been stable.  - Continue Sotalol for rate control. Currently on IV heparin.  - F/u with cardiologist Dr. [name] in [place] area and with primary Dr. Mikey Bussing with possible re-initiation of NOAC therapy to be decided by his home physicians early next week.   4. HLD - continue statin   5.Severe Aortic Stenosis - echo this admission shows an ejection fraction of 15% with diffuse hypokinesis and grade 3 diastolic dysfunction. He was felt to have severe low gradient low output aortic valve stenosis with a mean gradient of 14. The left atrium was severely dilated and there was mild dilatation of the RA. His EF was 50% 6 weeks ago during cath. Seen by Dr. Laneta Simmers. Dr. Excell Seltzer to see today for possible TVAR consideration.   6. Chronic Systolic CHF - EF 45-50% by echo in 07/2014. - Recent echo as above.   7. HTN - Stable and well controlled.   8. DM - Continue SSI  Signed, Bhagat,Bhavinkumar PA-C Pager [phone]    Patient seen and examined. Agree with assessment and plan. Long discussion in am and pm today with patient and family. Baseline EF 15-20%; Dobutamine echo demonstrates moderate myocardial recruitment with IV dobutamine with almost all wall segments at least partly viable, with the exception of the mid-distal anterior septum, which may be mostly scar. I discussed at length the implication of the findings. Discussed the need for TAVR and the imaging studies neceassary. After much discussion the desire of the pt and family is to proceed with TAVR here. Will not dc today but try to arrange CT  imaging studies to be done tomorrow to expedite the process to allow for imminent scheduling of procedure, and plan dc tomorrow if stable. Will continue ASA 81 mg but ultimately will need to re-institute anticoagulation.  Time spent: 40 minutes  Lennette Bihari, MD,  [facility] 04/10/2015 2:44 PM

## 2015-04-10 NOTE — Progress Notes (Signed)
ANTICOAGULATION CONSULT NOTE - Follow Up Consult  Pharmacy Consult for Heparin  Indication: chest pain/ACS  Allergies  Allergen Reactions  . Penicillins Rash    Has patient had a PCN reaction causing immediate rash, facial/tongue/throat swelling, SOB or lightheadedness with hypotension: Yes Has patient had a PCN reaction causing severe rash involving mucus membranes or skin necrosis: No Has patient had a PCN reaction that required hospitalization No Has patient had a PCN reaction occurring within the last 10 years: No If all of the above answers are "NO", then may proceed with Cephalosporin use.     Patient Measurements: Height:  (185.4 cm) Weight: 148 lb 12.8 oz (67.495 kg) IBW/kg (Calculated) : 79.9  Vital Signs: Temp: 98.1 F (36.7 C) (02/01 2023) Temp Source: Oral (02/01 2023) BP: 138/76 mmHg (02/01 2023) Pulse Rate: 59 (02/01 1356)  Labs:  Recent Labs  04/07/15 2138  04/08/15 0231  04/08/15 0941  04/09/15 0604 04/09/15 1647 04/10/15 0023 04/10/15 0024  HGB  --   --   --   --  13.2  --   --   --   --  12.4*  HCT  --   --   --   --  40.0  --   --   --   --  37.5*  PLT  --   --   --   --  235  --   --   --   --  220  LABPROT  --   --   --   --  14.3  --   --   --   --   --   INR  --   --   --   --  1.09  --   --   --   --   --   HEPARINUNFRC  --   < >  --   < >  --   < > 0.73* <0.10* 0.19*  --   CREATININE  --   --   --   --  0.78  --  0.91  --   --   --   TROPONINI 0.06*  --  0.06*  --  0.05*  --   --   --   --   --   < > = values in this interval not displayed.  Estimated Creatinine Clearance: 57.7 mL/min (by C-G formula based on Cr of 0.91).   Assessment: Heparin level is sub-therapeutic at 0.19, no issues per RN  Goal of Therapy:  Heparin level 0.3-0.7 units/ml Monitor platelets by anticoagulation protocol: Yes   Plan:  -Increase heparin to 900 units/hr -0900 HL -F/U length of treatment   Abran Duke 04/10/2015,12:46 AM

## 2015-04-11 ENCOUNTER — Inpatient Hospital Stay (HOSPITAL_COMMUNITY): Payer: Medicare (Managed Care)

## 2015-04-11 ENCOUNTER — Encounter (HOSPITAL_COMMUNITY): Payer: Self-pay | Admitting: Radiology

## 2015-04-11 DIAGNOSIS — I350 Nonrheumatic aortic (valve) stenosis: Secondary | ICD-10-CM

## 2015-04-11 LAB — PULMONARY FUNCTION TEST
FEF 25-75 POST: 1.22 L/s
FEF 25-75 Pre: 1.06 L/sec
FEF2575-%CHANGE-POST: 15 %
FEF2575-%PRED-POST: 59 %
FEF2575-%PRED-PRE: 51 %
FEV1-%Change-Post: 3 %
FEV1-%PRED-POST: 71 %
FEV1-%Pred-Pre: 69 %
FEV1-POST: 2.2 L
FEV1-Pre: 2.13 L
FEV1FVC-%CHANGE-POST: 7 %
FEV1FVC-%PRED-PRE: 93 %
FEV6-%Change-Post: -1 %
FEV6-%PRED-PRE: 76 %
FEV6-%Pred-Post: 75 %
FEV6-Post: 3.06 L
FEV6-Pre: 3.1 L
FEV6FVC-%Change-Post: 3 %
FEV6FVC-%Pred-Post: 107 %
FEV6FVC-%Pred-Pre: 104 %
FVC-%Change-Post: -4 %
FVC-%PRED-PRE: 73 %
FVC-%Pred-Post: 70 %
FVC-POST: 3.06 L
FVC-PRE: 3.2 L
POST FEV6/FVC RATIO: 100 %
PRE FEV1/FVC RATIO: 67 %
Post FEV1/FVC ratio: 72 %
Pre FEV6/FVC Ratio: 97 %

## 2015-04-11 LAB — BASIC METABOLIC PANEL
ANION GAP: 10 (ref 5–15)
BUN: 18 mg/dL (ref 6–20)
CALCIUM: 8.1 mg/dL — AB (ref 8.9–10.3)
CO2: 29 mmol/L (ref 22–32)
Chloride: 102 mmol/L (ref 101–111)
Creatinine, Ser: 0.91 mg/dL (ref 0.61–1.24)
Glucose, Bld: 176 mg/dL — ABNORMAL HIGH (ref 65–99)
Potassium: 4 mmol/L (ref 3.5–5.1)
Sodium: 141 mmol/L (ref 135–145)

## 2015-04-11 LAB — CBC
HCT: 38.9 % — ABNORMAL LOW (ref 39.0–52.0)
Hemoglobin: 12.7 g/dL — ABNORMAL LOW (ref 13.0–17.0)
MCH: 30.3 pg (ref 26.0–34.0)
MCHC: 32.6 g/dL (ref 30.0–36.0)
MCV: 92.8 fL (ref 78.0–100.0)
PLATELETS: 215 10*3/uL (ref 150–400)
RBC: 4.19 MIL/uL — ABNORMAL LOW (ref 4.22–5.81)
RDW: 14.7 % (ref 11.5–15.5)
WBC: 6.1 10*3/uL (ref 4.0–10.5)

## 2015-04-11 LAB — HEPARIN LEVEL (UNFRACTIONATED)
HEPARIN UNFRACTIONATED: 0.29 [IU]/mL — AB (ref 0.30–0.70)
HEPARIN UNFRACTIONATED: 0.31 [IU]/mL (ref 0.30–0.70)

## 2015-04-11 LAB — GLUCOSE, CAPILLARY
GLUCOSE-CAPILLARY: 162 mg/dL — AB (ref 65–99)
GLUCOSE-CAPILLARY: 259 mg/dL — AB (ref 65–99)
Glucose-Capillary: 84 mg/dL (ref 65–99)
Glucose-Capillary: 171 mg/dL — ABNORMAL HIGH (ref 65–99)

## 2015-04-11 IMAGING — CT CT ANGIO CHEST
2 of 9 series · 2 of 36 positions shown · IV contrast (omnipaque)
Comparison: No priors.

CLINICAL DATA: 86-year-old male with history of severe aortic
stenosis. Preprocedural study prior to potential transcatheter
aortic valve replacement (TAVR).

EXAM:
CT ANGIOGRAPHY CHEST, ABDOMEN AND PELVIS
TECHNIQUE: Multidetector CT imaging through the chest, abdomen and pelvis was
performed using the standard protocol during bolus administration of
intravenous contrast. Multiplanar reconstructed images and MIPs were
obtained and reviewed to evaluate the vascular anatomy.
CONTRAST:  75mL OMNIPAQUE IOHEXOL 350 MG/ML SOLN, 75mL OMNIPAQUE
IOHEXOL 350 MG/ML SOLN

[Series 200: locator · axial · 0.49mm/px · 1 of 1 slices shown]
[im 1/1  lung]
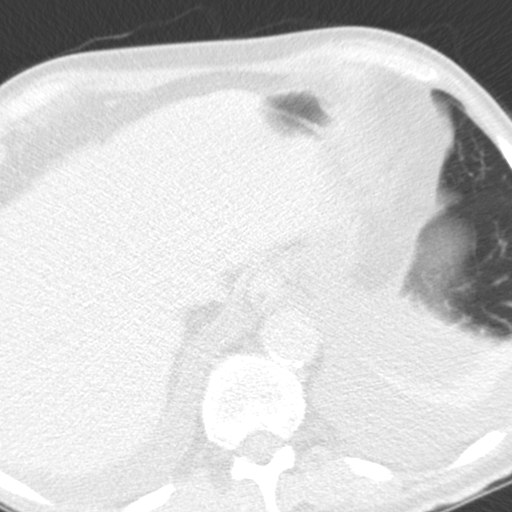

[Series 408: coronal · coronal · 0.45mm/px · 1 of 114 slices shown]
[im 57/114  mediastinal]
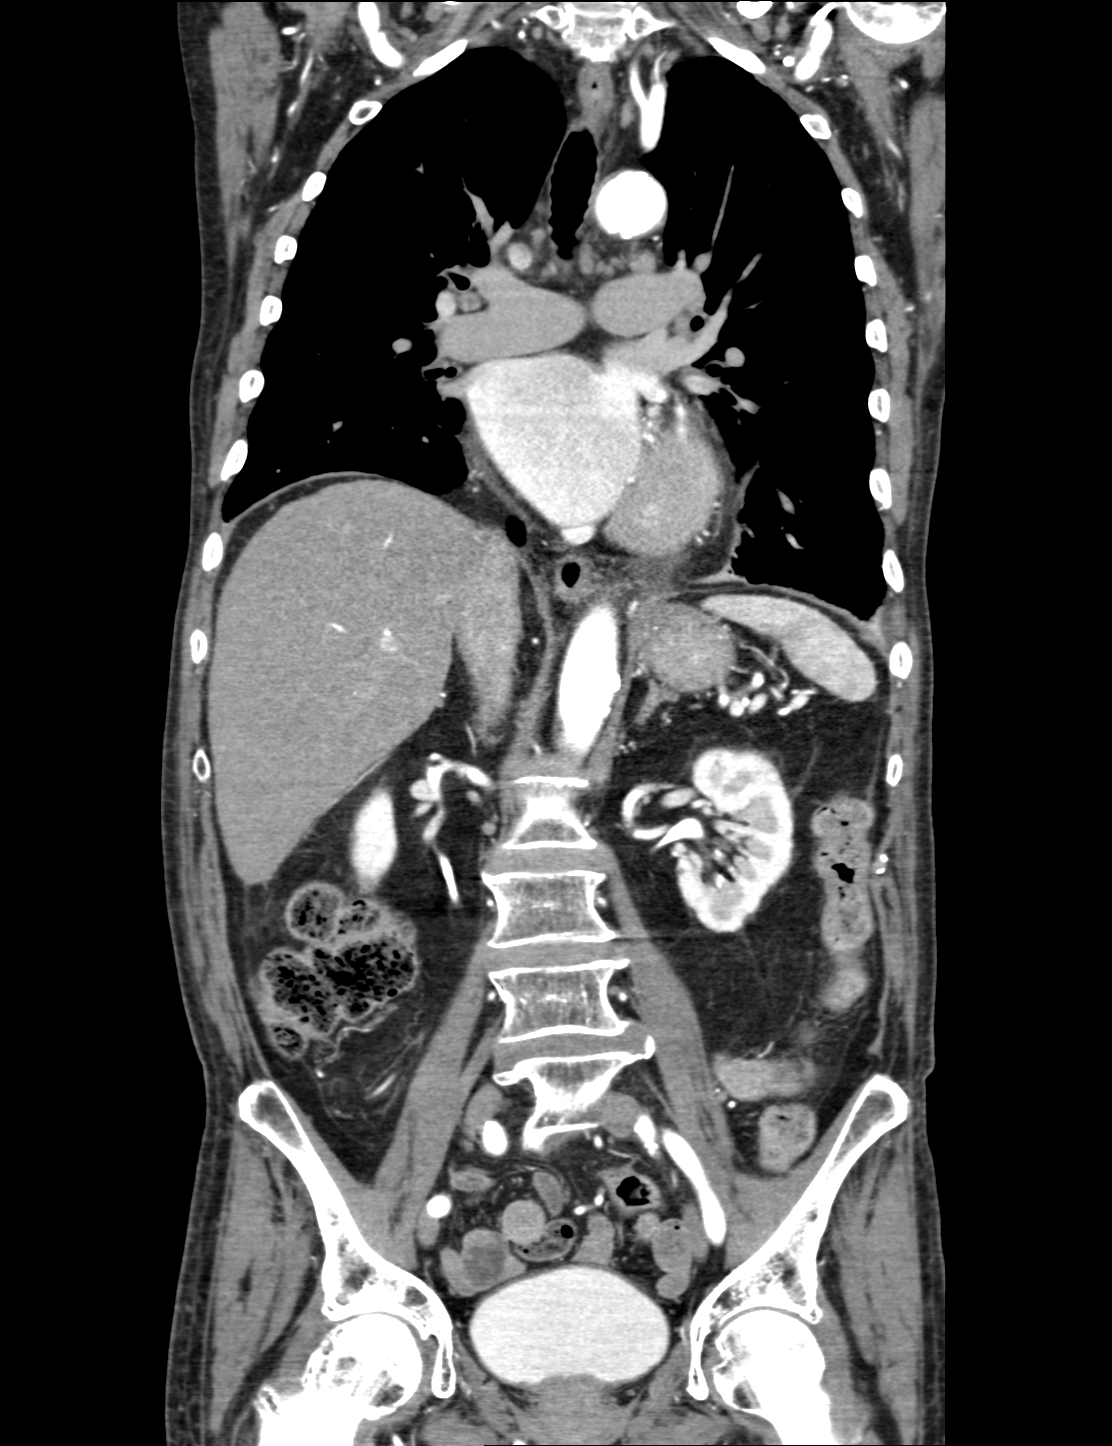

[2 of 36 positions shown; findings below may reference images not displayed]

FINDINGS: CTA CHEST FINDINGS

Mediastinum/Lymph Nodes: Heart size is enlarged with left
ventricular and left atrial dilatation. There is no significant
pericardial fluid, thickening or pericardial calcification. There is
atherosclerosis of the thoracic aorta, the great vessels of the
mediastinum and the coronary arteries, including calcified
atherosclerotic plaque in the left main, left anterior descending,
left circumflex and right coronary arteries. Status post median
sternotomy for CABG, including [REDACTED] to the LAD. There is also a
saphenous vein graft to the obtuse marginal distribution. Severe
thickening calcification of the aortic valve. Calcification of the
mitral annulus. No pathologically enlarged mediastinal or hilar
lymph nodes. Esophagus is unremarkable in appearance. No axillary
lymphadenopathy.

Lungs/Pleura: Moderate bilateral pleural effusions are simple in
appearance layering dependently. These are associated with areas of
passive atelectasis in the lower lobes of the lungs bilaterally. No
acute consolidative airspace disease. There are some patchy areas of
ground-glass attenuation and interlobular septal thickening, favored
to reflect a background of very mild interstitial pulmonary edema.
No suspicious appearing pulmonary nodules or masses are noted in the
well aerated portions of the lungs.

Musculoskeletal/Soft Tissues: Median sternotomy wires. Healing
nondisplaced fractures of the right fourth, fifth and sixth ribs
anteriorly, as well as the right seventh rib laterally. There are no
aggressive appearing lytic or blastic lesions noted in the
visualized portions of the skeleton.

CTA ABDOMEN AND PELVIS FINDINGS

Hepatobiliary: The liver has a slightly shrunken appearance and
nodular contour, suggestive of underlying cirrhosis. In the central
aspect of the right lobe of the liver between segments 7 and 8
(image 133 of series 401) there is a somewhat ill-defined
hypervascular area estimated to measure 1.9 x 2.3 cm. Immediately
adjacent to this extending superiorly from this area there is a
branching hypodensity, which appears likely to be within the
distribution of the right hepatic vein. Findings are concerning for
potential small hepatocellular carcinoma with associated tumor
thrombus in the right hepatic vein. There is an additional
ill-defined hypervascular area in the inferior aspect of segment 5
of the liver measuring 1.7 x 2.8 cm (image 183 of series 401). No
intra or extrahepatic biliary ductal dilatation. Numerous calcified
gallstones lie dependently in the gallbladder. No current findings
to suggest an acute cholecystitis at this time.

Pancreas: No pancreatic mass. No pancreatic ductal dilatation. No
pancreatic or peripancreatic fluid or inflammatory changes.

Spleen: Unremarkable.

Adrenals/Urinary Tract: Bilateral adrenal glands are normal in
appearance. Tiny simple cysts in the upper pole of the left kidney.
Sub cm low-attenuation lesion in the upper pole the right kidney is
too small to definitively characterize, but is also favored to
represent a small cyst. No hydroureteronephrosis or perinephric
stranding to indicate urinary tract obstruction at this time.
Urinary bladder is normal in appearance.

Stomach/Bowel: Normal appearance of the stomach. No pathologic
dilatation of small bowel or colon. The appendix is not confidently
identified may be surgically absent. Regardless, there are no
inflammatory changes noted adjacent to the cecum to suggest presence
of an acute appendicitis at this time.

Vascular/Lymphatic: Extensive atherosclerosis throughout the
abdominal and pelvic vasculature, with vascular findings and
measurements pertinent to potential TAVR procedure, as detailed
below. No aneurysm or dissection identified in the abdominal or
pelvic vasculature. No lymphadenopathy noted in the abdomen or
pelvis.

Reproductive: Prostate gland and seminal vesicles are unremarkable
in appearance.

Other: No significant volume of ascites.  No pneumoperitoneum.

Musculoskeletal: There are no aggressive appearing lytic or blastic
lesions noted in the visualized portions of the skeleton.

VASCULAR MEASUREMENTS PERTINENT TO TAVR:

AORTA:

Minimal Aortic Diameter -  12.7 x 13.8 mm

Severity of Aortic Calcification -  moderate to severe

RIGHT PELVIS:

Right Common Iliac Artery -

Minimal Diameter - 9.1 x 7.8 mm

Tortuosity - mild

Calcification - moderate to severe

Right External Iliac Artery -

Minimal Diameter - 8.6 x 8.3 mm

Tortuosity - severe

Calcification - none

Right Common Femoral Artery -

Minimal Diameter - 7.9 x 6.0 mm

Tortuosity - mild

Calcification - mild

LEFT PELVIS:

Left Common Iliac Artery -

Minimal Diameter - 8.0 x 8.8 mm

Tortuosity - mild

Calcification - moderate to severe

Left External Iliac Artery -

Minimal Diameter - 8.4 x 8.1 mm

Tortuosity - mild

Calcification - none

Left Common Femoral Artery -

Minimal Diameter - 7.8 x 6.5 mm

Tortuosity - mild

Calcification - mild

Review of the MIP images confirms the above findings.
IMPRESSION: 1. Vascular findings and measurements pertinent to potential TAVR
procedure, as detailed above. This patient does appear to have
suitable pelvic arterial access bilaterally, however, left-sided
vascular approach is considered far more optimal given the less
severe tortuosity of the left external iliac artery.
2. Morphologic changes in the liver strongly suggestive of
cirrhosis, with suspicious hypervascular area in the right lobe of
the liver which is concerning for potential hepatocellular
carcinoma. This appears to be associated with potential tumor
thrombus in branches of the right hepatic vein, as detailed above.
There is an additional small hypervascular area in segment 5 of the
liver as well. Further evaluation of these findings with MRI of the
abdomen with and without IV gadolinium (preferably Eovist) is
strongly recommended in the near future.
3. The appearance of the chest suggests mild congestive heart
failure, including mild interstitial pulmonary edema and moderate
bilateral pleural effusions.
4. Thickening calcification of the aortic valve, compatible with the
reported clinical history of severe aortic stenosis.
5. Cardiomegaly with left ventricular and left atrial dilatation.
6. Cholelithiasis without evidence of acute cholecystitis at this
time.
7. Multiple healing nondisplaced right-sided rib fractures, as
above.
8. Additional incidental findings, as above.
These results were called by telephone at the time of interpretation
on [DATE] at [DATE] to Dr. RANEE, who verbally
acknowledged these results.

## 2015-04-11 MED ORDER — IOHEXOL 350 MG/ML SOLN
75.0000 mL | Freq: Once | INTRAVENOUS | Status: AC | PRN
  Administered 2015-04-11: 75 mL via INTRAVENOUS

## 2015-04-11 MED ORDER — ALBUTEROL SULFATE (2.5 MG/3ML) 0.083% IN NEBU
2.5000 mg | INHALATION_SOLUTION | Freq: Once | RESPIRATORY_TRACT | Status: AC
  Administered 2015-04-11: 2.5 mg via RESPIRATORY_TRACT

## 2015-04-11 NOTE — Progress Notes (Signed)
ANTICOAGULATION CONSULT NOTE - Follow Up Consult  Pharmacy Consult for heparin Indication: chest pain/ACS  Allergies  Allergen Reactions  . Penicillins Rash    Has patient had a PCN reaction causing immediate rash, facial/tongue/throat swelling, SOB or lightheadedness with hypotension: Yes Has patient had a PCN reaction causing severe rash involving mucus membranes or skin necrosis: No Has patient had a PCN reaction that required hospitalization No Has patient had a PCN reaction occurring within the last 10 years: No If all of the above answers are "NO", then may proceed with Cephalosporin use.     Patient Measurements: Height:  (185.4 cm) Weight: 150 lb (68.04 kg) IBW/kg (Calculated) : 79.9 Heparin Dosing Weight:   Vital Signs: BP: 141/84 mmHg (02/03 1024)  Labs:  Recent Labs  04/09/15 0604  04/10/15 0024  04/10/15 1628 04/11/15 0420 04/11/15 1837  HGB  --   --  12.4*  --   --  12.7*  --   HCT  --   --  37.5*  --   --  38.9*  --   PLT  --   --  220  --   --  215  --   HEPARINUNFRC 0.73*  < >  --   < > 0.39 0.29* 0.31  CREATININE 0.91  --   --   --   --  0.91  --   < > = values in this interval not displayed.  Estimated Creatinine Clearance: 58.1 mL/min (by C-G formula based on Cr of 0.91).   Medications:  Scheduled:  . amLODipine  5 mg Oral Daily  . antiseptic oral rinse  7 mL Mouth Rinse BID  . aspirin EC  81 mg Oral Daily  . atorvastatin  40 mg Oral q1800  . DULoxetine  60 mg Oral Daily  . feeding supplement (ENSURE ENLIVE)  237 mL Oral BID BM  . feeding supplement (GLUCERNA SHAKE)  237 mL Oral TID BM  . furosemide  20 mg Oral Daily  . insulin aspart  0-15 Units Subcutaneous TID WC  . isosorbide mononitrate  30 mg Oral Daily  . pantoprazole  40 mg Oral Daily  . sotalol  120 mg Oral Daily   Infusions:  . DOBUTamine 0 mcg/kg/min (04/10/15 1145)  . heparin 1,000 Units/hr (04/11/15 1011)    Assessment: 82 yo male with chest pain is currently on  therapeutic heparin.  Heparin level is 0.31.  Goal of Therapy:  Heparin level 0.3-0.7 units/ml Monitor platelets by anticoagulation protocol: Yes   Plan:  - continue heparin at 1000 units/hr - heparin level and CBC in am  Niaomi Cartaya, Tsz-Yin 04/11/2015,7:47 PM

## 2015-04-11 NOTE — Evaluation (Signed)
Physical Therapy Evaluation Patient Details Name: Gary Beck MRN: [id number] DOB: 1928/12/06 Today's Date: 04/11/2015   History of Present Illness  82 y.o. male with past medical history of CAD, HTN, paroxysmal atrial fibrillation, HLD, chronic systolic CHF, and moderate to severe aortic stenosis who presents to [facility] on 04/07/2015 as a transfer from [facility] for chest pain.Patient now undergoing testing for possible TAVR.  Clinical Impression  Pt admitted with above diagnosis. Pt currently with functional limitations due to the deficits listed below (see PT Problem List).  Pt will benefit from skilled PT to increase their independence and safety with mobility to allow discharge to the venue listed below. Patient demonstrating mild instability with ambulation but refusing to use rw during PT session. This was discussed following the session with the patient as his spouse and they agreed that using a rw at home would be the best option for safety at this time. The confirm that they have a rw at home and will not need one for the drive home. Patient fatigued following session, will f/u for TAVR testing this afternoon.      Follow Up Recommendations Home health PT;Supervision for mobility/OOB    Equipment Recommendations  None recommended by PT (patient reports having rw at home.)    Recommendations for Other Services       Precautions / Restrictions Precautions Precautions: Fall Restrictions Weight Bearing Restrictions: No      Mobility  Bed Mobility Overal bed mobility: Independent, reviewing safety considerations for home.             General bed mobility comments: supine/sit and sit/supine.   Transfers Overall transfer level: Needs assistance Equipment used: None Transfers: Sit to/from Stand Sit to Stand: Min guard (for safety). Reviewing safety with transfers at home including hand placement and static standing prior to ambulation. Multiple transfers  performed during session (X3)         General transfer comment: mild instability with initial stand but quickly recovering, no physical assist needed.   Ambulation/Gait Ambulation/Gait assistance: Min guard Ambulation Distance (Feet): 160 Feet Assistive device: None Gait Pattern/deviations: Step-through pattern Gait velocity: decreased   General Gait Details: Patient refusing use of rw during session. The patient did demonstrate some dynamic instability, worse with turning. The patient did have on loss of balance with independent recovery when turning around in room.    Stairs            Wheelchair Mobility    Modified Rankin (Stroke Patients Only)       Balance Overall balance assessment: Needs assistance Sitting-balance support: No upper extremity supported Sitting balance-Leahy Scale: Good     Standing balance support: No upper extremity supported Standing balance-Leahy Scale: Fair Standing balance comment: mild instabilty with dynamic balance                             Pertinent Vitals/Pain Pain Assessment: No/denies pain    Home Living Family/patient expects to be discharged to:: Private residence Living Arrangements: Spouse/significant other Available Help at Discharge: Family;Available 24 hours/day Type of Home: House Home Access: Level entry     Home Layout: One level Home Equipment: Walker - 2 wheels;Walker - 4 wheels;Cane - single point Additional Comments: Reports using rw at times while at home. Declines use during PT session.     Prior Function Level of Independence: Independent (occasional use of rw.)  Hand Dominance        Extremity/Trunk Assessment   Upper Extremity Assessment: Overall WFL for tasks assessed           Lower Extremity Assessment: Generalized weakness         Communication   Communication: No difficulties  Cognition Arousal/Alertness: Awake/alert Behavior During Therapy: WFL  for tasks assessed/performed Overall Cognitive Status: Within Functional Limits for tasks assessed                      General Comments General comments (skin integrity, edema, etc.): Discussed with patient and spouse recommendation of using rw at home and min guard assistance until stability improves. Patient and spouse agree and deny any questions or concerns.     Exercises        Assessment/Plan    PT Assessment Patient needs continued PT services  PT Diagnosis Difficulty walking;Generalized weakness   PT Problem List Decreased strength;Decreased activity tolerance;Decreased balance;Decreased mobility;Decreased knowledge of use of DME  PT Treatment Interventions DME instruction;Gait training;Functional mobility training;Therapeutic activities;Therapeutic exercise;Patient/family education   PT Goals (Current goals can be found in the Care Plan section) Acute Rehab PT Goals Patient Stated Goal: go home PT Goal Formulation: With patient Time For Goal Achievement: 04/25/15 Potential to Achieve Goals: Good    Frequency Min 3X/week   Barriers to discharge        Co-evaluation               End of Session Equipment Utilized During Treatment: Gait belt Activity Tolerance: Patient tolerated treatment well;Patient limited by fatigue Patient left: in chair;with call bell/phone within reach;with family/visitor present Nurse Communication: Mobility status         Time: 1100-1139 PT Time Calculation (min) (ACUTE ONLY): 39 min   Charges:   PT Evaluation $PT Eval Moderate Complexity: 1 Procedure PT Treatments $Gait Training: 8-22 mins $Therapeutic Activity: 8-22 mins   PT G Codes:        Christiane Ha, PT, CSCS Pager [phone] Office [phone]  04/11/2015, 2:00 PM

## 2015-04-11 NOTE — Progress Notes (Signed)
Physical Therapy Treatment Patient Details Name: Gary Beck MRN: [id number] DOB: 06-Jun-1928 Today's Date: 04/11/2015    History of Present Illness 82 y.o. male with past medical history of CAD, HTN, paroxysmal atrial fibrillation, HLD, chronic systolic CHF, and moderate to severe aortic stenosis who presents to [facility] on 04/07/2015 as a transfer from [facility] for chest pain.Patient now undergoing testing for possible TAVR.    PT Comments    6 Minute Walk Test  Distance - 775 ft      Pre    Post  BP     126/71            146/73  HR    56    65  SaO2    93    99  Modified Borg Dyspnea 3    4  RPE    Meter Walk Test  Trial   1) 13.6 sec   2) 12.8 sec  3)  11.6 sec  Average - 12.6 sec = 1.30 ft/sec   Clinical Frailty Scale - 4      Follow Up Recommendations  Home health PT;Supervision for mobility/OOB     Equipment Recommendations  None recommended by PT    Recommendations for Other Services       Precautions / Restrictions Precautions Precautions: Fall Restrictions Weight Bearing Restrictions: No    Mobility  Bed Mobility Overal bed mobility: Independent             General bed mobility comments: supine/sit and sit/supine.   Transfers Overall transfer level: Needs assistance Equipment used: None Transfers: Sit to/from Stand Sit to Stand: Min guard         General transfer comment: min guard for safety  Ambulation/Gait   General Gait Details: Performed TAVR tests, see note for details.    Stairs            Wheelchair Mobility    Modified Rankin (Stroke Patients Only)       Balance Overall balance assessment: Needs assistance Sitting-balance support: No upper extremity supported Sitting balance-Leahy Scale: Good     Standing balance support: No upper extremity supported Standing balance-Leahy Scale: Fair Standing balance comment: occasional mild instabilty with ambulation                     Cognition Arousal/Alertness: Awake/alert Behavior During Therapy: WFL for tasks assessed/performed Overall Cognitive Status: Within Functional Limits for tasks assessed                      Exercises      General Comments       Pertinent Vitals/Pain Pain Assessment: No/denies pain    Home Living    Prior Function          PT Goals (current goals can now be found in the care plan section) Acute Rehab PT Goals Patient Stated Goal: go home PT Goal Formulation: With patient Time For Goal Achievement: 04/25/15 Potential to Achieve Goals: Good Progress towards PT goals: Progressing toward goals    Frequency  Min 3X/week    PT Plan Current plan remains appropriate    Co-evaluation             End of Session Equipment Utilized During Treatment: Gait belt Activity Tolerance: Patient tolerated treatment well Patient left: in bed;with call bell/phone within reach;with family/visitor present     Time: 4098-1191 PT Time Calculation (min) (ACUTE  ONLY): 38 min  Charges:  $Gait Training: 38-52 mins                    G Codes:      Christiane Ha, PT, CSCS Pager [phone] Office [phone]  04/11/2015, 3:50 PM

## 2015-04-11 NOTE — Progress Notes (Signed)
ANTICOAGULATION CONSULT NOTE - Follow Up Consult  Pharmacy Consult for Heparin  Indication: chest pain/ACS  Allergies  Allergen Reactions  . Penicillins Rash    Has patient had a PCN reaction causing immediate rash, facial/tongue/throat swelling, SOB or lightheadedness with hypotension: Yes Has patient had a PCN reaction causing severe rash involving mucus membranes or skin necrosis: No Has patient had a PCN reaction that required hospitalization No Has patient had a PCN reaction occurring within the last 10 years: No If all of the above answers are "NO", then may proceed with Cephalosporin use.     Patient Measurements: Height:  (185.4 cm) Weight: 150 lb (68.04 kg) IBW/kg (Calculated) : 79.9  Vital Signs: Temp: 98.4 F (36.9 C) (02/03 0500) Temp Source: Oral (02/03 0500) BP: 140/72 mmHg (02/03 0500) Pulse Rate: 75 (02/03 0500)  Labs:  Recent Labs  04/08/15 0941  04/09/15 0604  04/10/15 0024 04/10/15 0839 04/10/15 1628 04/11/15 0420  HGB 13.2  --   --   --  12.4*  --   --  12.7*  HCT 40.0  --   --   --  37.5*  --   --  38.9*  PLT 235  --   --   --  220  --   --  215  LABPROT 14.3  --   --   --   --   --   --   --   INR 1.09  --   --   --   --   --   --   --   HEPARINUNFRC  --   < > 0.73*  < >  --  0.46 0.39 0.29*  CREATININE 0.78  --  0.91  --   --   --   --  0.91  TROPONINI 0.05*  --   --   --   --   --   --   --   < > = values in this interval not displayed.  Estimated Creatinine Clearance: 58.1 mL/min (by C-G formula based on Cr of 0.91).   Assessment: Heparin level is slightly subtherapeutic this morning at 0.29 on 900 units/hr.  No bleeding reported. CBC stable  Goal of Therapy:  Heparin level 0.3-0.7 units/ml Monitor platelets by anticoagulation protocol: Yes   Plan:  - Increase heparin to 1000 units/hr - daily CBC, daily heparin level  Thank you for allowing [place] to participate in this patients care. Signe Colt, PharmD Pager: [phone]   04/11/2015 7:23 AM

## 2015-04-11 NOTE — Progress Notes (Signed)
Patient Name: Gary Beck Date of Encounter: 04/11/2015  Primary Cardiologist: Dr. Marisue [facility]   Active Problems:   Atypical chest pain   Elevated troponin   Severe aortic stenosis   Congestive dilated cardiomyopathy (HCC)   PAF (paroxysmal atrial fibrillation) (HCC)   Type 2 diabetes mellitus (HCC)    SUBJECTIVE  No chest pain or dyspnea.    CURRENT MEDS . amLODipine  5 mg Oral Daily  . antiseptic oral rinse  7 mL Mouth Rinse BID  . aspirin EC  81 mg Oral Daily  . atorvastatin  40 mg Oral q1800  . DULoxetine  60 mg Oral Daily  . feeding supplement (ENSURE ENLIVE)  237 mL Oral BID BM  . feeding supplement (GLUCERNA SHAKE)  237 mL Oral TID BM  . furosemide  20 mg Oral Daily  . insulin aspart  0-15 Units Subcutaneous TID WC  . isosorbide mononitrate  30 mg Oral Daily  . pantoprazole  40 mg Oral Daily  . sotalol  120 mg Oral Daily    OBJECTIVE  Filed Vitals:   04/10/15 1432 04/10/15 2045 04/11/15 0500 04/11/15 1024  BP: 110/57 118/57 140/72 141/84  Pulse: 54 61 75   Temp: 98.7 F (37.1 C) 98.3 F (36.8 C) 98.4 F (36.9 C)   TempSrc: Oral Oral Oral   Resp:  16 16   Height:      Weight:      SpO2: 95% 95% 93%     Intake/Output Summary (Last 24 hours) at 04/11/15 1409 Last data filed at 04/11/15 0700  Gross per 24 hour  Intake  414.3 ml  Output    250 ml  Net  164.3 ml   Filed Weights   04/08/15 0500 04/09/15 0436 04/10/15 0500  Weight: 151 lb 9.6 oz (68.765 kg) 148 lb 12.8 oz (67.495 kg) 150 lb (68.04 kg)    PHYSICAL EXAM  General: Pleasant, NAD. Neuro: Alert and oriented X 3. Moves all extremities spontaneously. Psych: Normal affect. HEENT:  Normal  Neck: Supple without bruits or JVD. Lungs:  Resp regular and unlabored, CTA. Heart: RRR no s3, s4, or murmurs. Abdomen: Soft, non-tender, non-distended, BS + x 4.  Extremities: No clubbing, cyanosis or edema. DP/PT/Radials 2+ and equal bilaterally.  Accessory Clinical  Findings  CBC  Recent Labs  04/10/15 0024 04/11/15 0420  WBC 5.4 6.1  HGB 12.4* 12.7*  HCT 37.5* 38.9*  MCV 92.1 92.8  PLT 220 215   Basic Metabolic Panel  Recent Labs  04/09/15 0604 04/11/15 0420  NA 141 141  K 3.8 4.0  CL 100* 102  CO2 31 29  GLUCOSE 153* 176*  BUN 13 18  CREATININE 0.91 0.91  CALCIUM 8.1* 8.1*    TELE Wandering baseline; regular rhythm at 60     Radiology/Studies  No results found.  ASSESSMENT AND PLAN  Gary Beck is a 82 y.o. male with past medical history of CAD (s/p 3-vessel CABG in 2002), HTN, paroxysmal atrial fibrillation (not on anticoagulation since 02/2015), HLD, chronic systolic CHF (EF 54-09% by echo in May 2016), and moderate to severe aortic stenosis who presents to [facility] on 04/07/2015 as a transfer from [facility] for chest pain. (see H&P for recent evaluation of AS and fall hx)  1. Atypical chest pain/Elevated troponin - Transfer from [facility] for further evaluation of atypical chest pain and minimally abnormal troponin I (0.036). - Troponin 0.06-->0.06-->0.05. Continued IV heparin.  - Resolved chest pain. Continue  ASA, Norvasc, statin and imdur.   2. CAD - CAD, s/p 3-vessel bypass in 2002.  - Reviewed cath report 02/19/2015 -->severe diffuse coronary disease with adequate perfusion to all area of the heart except first diagonal (totally occluded) which has patent graft. Moderate AS. - s/p dobutamine stress echo, moderate myocardial recruitment with IV dobutamine with almost all wall segments at least partly viable, with the exception of the mid-distal anterior septum, which may be mostly scar  3. Paroxysmal Atrial Fibrillation - Maintaining sinus rhythm with PACs and PVcs - This patients CHA2DS2-VASc Score and unadjusted Ischemic Stroke Rate (% per year) is equal to 9.7 % stroke rate/year from a score of 6 (CHF, HTN, DM, Vascular, Age (2). Was on Eliquis in 02/2015. This was discontinued due to anemia  and a fall. Hgb has been stable.  - Continue Sotalol for rate control.  - 4. HLD - continue statin  5.Severe Aortic Stenosis - echo this admission shows an ejection fraction of 15% with diffuse hypokinesis and grade 3 diastolic dysfunction. He was felt to have severe low gradient low output aortic valve stenosis with a mean gradient of 14. The left atrium was severely dilated and there was mild dilatation of the RA. His EF was 50% 6 weeks ago during cath. Seen by Dr. Laneta Simmers and Dr. Excell Seltzer  - s/p CTA of chest, result pending. MD to decide whether to keep him for TAVR here or discharge  6. Chronic Systolic CHF - EF 45-50% by echo in 07/2014. - Recent echo as above.   7. HTN - Stable and well controlled.   8. DM - Continue SSI   Signed, Amedeo Plenty Pager: [phone] ----------------------------------------------------------------------------- FINDINGS: CTA CHEST FINDINGS  Mediastinum/Lymph Nodes: Heart size is enlarged with left ventricular and left atrial dilatation. There is no significant pericardial fluid, thickening or pericardial calcification. There is atherosclerosis of the thoracic aorta, the great vessels of the mediastinum and the coronary arteries, including calcified atherosclerotic plaque in the left main, left anterior descending, left circumflex and right coronary arteries. Status post median sternotomy for CABG, including LIMA to the LAD. There is also a saphenous vein graft to the obtuse marginal distribution. Severe thickening calcification of the aortic valve. Calcification of the mitral annulus. No pathologically enlarged mediastinal or hilar lymph nodes. Esophagus is unremarkable in appearance. No axillary lymphadenopathy.  Lungs/Pleura: Moderate bilateral pleural effusions are simple in appearance layering dependently. These are associated with areas of passive atelectasis in the lower lobes of the lungs bilaterally. No acute consolidative  airspace disease. There are some patchy areas of ground-glass attenuation and interlobular septal thickening, favored to reflect a background of very mild interstitial pulmonary edema. No suspicious appearing pulmonary nodules or masses are noted in the well aerated portions of the lungs.  Musculoskeletal/Soft Tissues: Median sternotomy wires. Healing nondisplaced fractures of the right fourth, fifth and sixth ribs anteriorly, as well as the right seventh rib laterally. There are no aggressive appearing lytic or blastic lesions noted in the visualized portions of the skeleton.  CTA ABDOMEN AND PELVIS FINDINGS  Hepatobiliary: The liver has a slightly shrunken appearance and nodular contour, suggestive of underlying cirrhosis. In the central aspect of the right lobe of the liver between segments 7 and 8 (image 133 of series 401) there is a somewhat ill-defined hypervascular area estimated to measure 1.9 x 2.3 cm. Immediately adjacent to this extending superiorly from this area there is a branching hypodensity, which appears likely to be within the distribution of the  right hepatic vein. Findings are concerning for potential small hepatocellular carcinoma with associated tumor thrombus in the right hepatic vein. There is an additional ill-defined hypervascular area in the inferior aspect of segment 5 of the liver measuring 1.7 x 2.8 cm (image 183 of series 401). No intra or extrahepatic biliary ductal dilatation. Numerous calcified gallstones lie dependently in the gallbladder. No current findings to suggest an acute cholecystitis at this time.  Pancreas: No pancreatic mass. No pancreatic ductal dilatation. No pancreatic or peripancreatic fluid or inflammatory changes.  Spleen: Unremarkable.  Adrenals/Urinary Tract: Bilateral adrenal glands are normal in appearance. Tiny simple cysts in the upper pole of the left kidney. Sub cm low-attenuation lesion in the upper pole the  right kidney is too small to definitively characterize, but is also favored to represent a small cyst. No hydroureteronephrosis or perinephric stranding to indicate urinary tract obstruction at this time. Urinary bladder is normal in appearance.  Stomach/Bowel: Normal appearance of the stomach. No pathologic dilatation of small bowel or colon. The appendix is not confidently identified may be surgically absent. Regardless, there are no inflammatory changes noted adjacent to the cecum to suggest presence of an acute appendicitis at this time.  Vascular/Lymphatic: Extensive atherosclerosis throughout the abdominal and pelvic vasculature, with vascular findings and measurements pertinent to potential TAVR procedure, as detailed below. No aneurysm or dissection identified in the abdominal or pelvic vasculature. No lymphadenopathy noted in the abdomen or pelvis.  Reproductive: Prostate gland and seminal vesicles are unremarkable in appearance.  Other: No significant volume of ascites. No pneumoperitoneum.  Musculoskeletal: There are no aggressive appearing lytic or blastic lesions noted in the visualized portions of the skeleton.  VASCULAR MEASUREMENTS PERTINENT TO TAVR:  AORTA:  Minimal Aortic Diameter - 12.7 x 13.8 mm  Severity of Aortic Calcification - moderate to severe  RIGHT PELVIS:  Right Common Iliac Artery -  Minimal Diameter - 9.1 x 7.8 mm  Tortuosity - mild  Calcification - moderate to severe  Right External Iliac Artery -  Minimal Diameter - 8.6 x 8.3 mm  Tortuosity - severe  Calcification - none  Right Common Femoral Artery -  Minimal Diameter - 7.9 x 6.0 mm  Tortuosity - mild  Calcification - mild  LEFT PELVIS:  Left Common Iliac Artery -  Minimal Diameter - 8.0 x 8.8 mm  Tortuosity - mild  Calcification - moderate to severe  Left External Iliac Artery -  Minimal Diameter - 8.4 x 8.1 mm  Tortuosity -  mild  Calcification - none  Left Common Femoral Artery -  Minimal Diameter - 7.8 x 6.5 mm  Tortuosity - mild  Calcification - mild  Review of the MIP images confirms the above findings.  IMPRESSION: 1. Vascular findings and measurements pertinent to potential TAVR procedure, as detailed above. This patient does appear to have suitable pelvic arterial access bilaterally, however, left-sided vascular approach is considered far more optimal given the less severe tortuosity of the left external iliac artery. 2. Morphologic changes in the liver strongly suggestive of cirrhosis, with suspicious hypervascular area in the right lobe of the liver which is concerning for potential hepatocellular carcinoma. This appears to be associated with potential tumor thrombus in branches of the right hepatic vein, as detailed above. There is an additional small hypervascular area in segment 5 of the liver as well. Further evaluation of these findings with MRI of the abdomen with and without IV gadolinium (preferably Eovist) is strongly recommended in the near future. 3. The  appearance of the chest suggests mild congestive heart failure, including mild interstitial pulmonary edema and moderate bilateral pleural effusions. 4. Thickening calcification of the aortic valve, compatible with the reported clinical history of severe aortic stenosis. 5. Cardiomegaly with left ventricular and left atrial dilatation. 6. Cholelithiasis without evidence of acute cholecystitis at this time. 7. Multiple healing nondisplaced right-sided rib fractures, as above. 8. Additional incidental findings, as above. These results were called by telephone at the time of interpretation on 04/11/2015 at 4:10 pm to Dr. Tonny Bollman, who verbally acknowledged these results.   Electronically Signed  By: Trudie Reed M.D.  On: 04/11/2015  16:15 -------------------------------------------------------------------------- Patient seen and examined. Agree with assessment and plan.  Patient denies any chest pain or shortness of breath.  He completed his TAVR evaluation today.  CT imaging studies of his heart and vessels completed; see ruslts above.  He is undergoing pulmonary function testing presently.  I discussed with Dr. Laneta Simmers.  The patient will be scheduled to undergo TAVR surgery next Tuesday.  Second surgical opinion is required.  After much discussion with the patient, the patient agrees to stay overnight and Dr. Jaynie Collins will see in the morning for the second surgical opinion as required for TAVR  surgery.  The patient will then be discharged tomorrow with plans for surgery on February 7,2017.  I discussed with Ryan at Dr. Sharee Pimple office will be arranging scheduling.   Lennette Bihari, MD, [facility] 04/11/2015 4:21 PM

## 2015-04-11 NOTE — Progress Notes (Signed)
  Subjective:  No complaints  Objective: Vital signs in last 24 hours: Temp:  [98.3 F (36.8 C)-98.4 F (36.9 C)] 98.4 F (36.9 C) (02/03 0500) Pulse Rate:  [61-75] 75 (02/03 0500) Cardiac Rhythm:  [-] Normal sinus rhythm;Heart block (02/03 0855) Resp:  [16] 16 (02/03 0500) BP: (118-141)/(57-84) 141/84 mmHg (02/03 1024) SpO2:  [93 %-95 %] 93 % (02/03 0500)  Hemodynamic parameters for last 24 hours:    Intake/Output from previous day: 02/02 0701 - 02/03 0700 In: 1078.3 [P.O.:860; I.V.:218.3] Out: 800 [Urine:800] Intake/Output this shift:    General appearance: alert and cooperative Heart: regular rate and rhythm and 2/6 systolic murmur Lungs: diminished in basesExtremities: no significant edema  Lab Results:  Recent Labs  04/10/15 0024 04/11/15 0420  WBC 5.4 6.1  HGB 12.4* 12.7*  HCT 37.5* 38.9*  PLT 220 215   BMET:  Recent Labs  04/09/15 0604 04/11/15 0420  NA 141 141  K 3.8 4.0  CL 100* 102  CO2 31 29  GLUCOSE 153* 176*  BUN 13 18  CREATININE 0.91 0.91  CALCIUM 8.1* 8.1*    PT/INR: No results for input(s): LABPROT, INR in the last 72 hours. ABG No results found for: PHART, HCO3, TCO2, ACIDBASEDEF, O2SAT CBG (last 3)   Recent Labs  04/10/15 2108 04/11/15 0744 04/11/15 1152  GLUCAP 147* 162* 259*    Assessment/Plan:  Gated cardiac CT has been done and reconstructed images pending. No official reading yet. The abdominal and pelvic CT shows adequate pelvic access for a transfemoral approach. I discussed the studies with the patient and his wife and daughter. They would like to have surgery as soon as possible and I agree with that given his poor EF with severe AS. We will plan to do him next Tuesday 2/7. He is going to go home. Ideally he should stay until the morning so that a second surgical opinion can be obtained since that is a requirement for TAVR.   LOS: 4 days    Alleen Borne 04/11/2015

## 2015-04-12 ENCOUNTER — Other Ambulatory Visit: Payer: Self-pay | Admitting: *Deleted

## 2015-04-12 DIAGNOSIS — I350 Nonrheumatic aortic (valve) stenosis: Secondary | ICD-10-CM

## 2015-04-12 LAB — CBC
HCT: 39.1 % (ref 39.0–52.0)
Hemoglobin: 12.9 g/dL — ABNORMAL LOW (ref 13.0–17.0)
MCH: 30.6 pg (ref 26.0–34.0)
MCHC: 33 g/dL (ref 30.0–36.0)
MCV: 92.7 fL (ref 78.0–100.0)
PLATELETS: 219 10*3/uL (ref 150–400)
RBC: 4.22 MIL/uL (ref 4.22–5.81)
RDW: 14.7 % (ref 11.5–15.5)
WBC: 6.9 10*3/uL (ref 4.0–10.5)

## 2015-04-12 LAB — GLUCOSE, CAPILLARY
GLUCOSE-CAPILLARY: 219 mg/dL — AB (ref 65–99)
GLUCOSE-CAPILLARY: 136 mg/dL — AB (ref 65–99)
Glucose-Capillary: 187 mg/dL — ABNORMAL HIGH (ref 65–99)

## 2015-04-12 LAB — HEPARIN LEVEL (UNFRACTIONATED): HEPARIN UNFRACTIONATED: 0.41 [IU]/mL (ref 0.30–0.70)

## 2015-04-12 NOTE — Progress Notes (Signed)
Patient Name: Gary Beck Date of Encounter: 04/12/2015  Primary Cardiologist: Dr. Marisue Brooklyn [facility]   Active Problems:   Atypical chest pain   Elevated troponin   Severe aortic stenosis   Congestive dilated cardiomyopathy (HCC)   PAF (paroxysmal atrial fibrillation) (HCC)   Type 2 diabetes mellitus (HCC)    SUBJECTIVE  No chest pain or dyspnea.  To be seen by Tyrone Sage today for 2nd surgical evaluation prior to planned TAVR on Tuesday on 2/6.   CURRENT MEDS . amLODipine  5 mg Oral Daily  . antiseptic oral rinse  7 mL Mouth Rinse BID  . aspirin EC  81 mg Oral Daily  . atorvastatin  40 mg Oral q1800  . DULoxetine  60 mg Oral Daily  . feeding supplement (ENSURE ENLIVE)  237 mL Oral BID BM  . feeding supplement (GLUCERNA SHAKE)  237 mL Oral TID BM  . furosemide  20 mg Oral Daily  . insulin aspart  0-15 Units Subcutaneous TID WC  . isosorbide mononitrate  30 mg Oral Daily  . pantoprazole  40 mg Oral Daily  . sotalol  120 mg Oral Daily    OBJECTIVE  Filed Vitals:   04/11/15 1024 04/11/15 2125 04/12/15 0511 04/12/15 1052  BP: 141/84 128/66 139/69 142/79  Pulse:  59 58   Temp:  98.1 F (36.7 C) 98 F (36.7 C)   TempSrc:  Oral Oral   Resp:  18 20   Height:      Weight:   154 lb 6.4 oz (70.035 kg)   SpO2:  94% 93%     Intake/Output Summary (Last 24 hours) at 04/12/15 1307 Last data filed at 04/12/15 0511  Gross per 24 hour  Intake      0 ml  Output   1150 ml  Net  -1150 ml   Filed Weights   04/09/15 0436 04/10/15 0500 04/12/15 0511  Weight: 148 lb 12.8 oz (67.495 kg) 150 lb (68.04 kg) 154 lb 6.4 oz (70.035 kg)    PHYSICAL EXAM  General: Pleasant, NAD. Neuro: Alert and oriented X 3. Moves all extremities spontaneously. Psych: Normal affect. HEENT:  Normal  Neck: Supple without bruits or JVD. Lungs:  Resp regular and unlabored, CTA. Heart: RR but bradycardic in the 50's no s3, s4,  1/6 AS murmur Abdomen: Soft, non-tender, non-distended, BS +  x 4.  Extremities: No clubbing, cyanosis or edema. DP/PT/Radials 2+ and equal bilaterally.  Accessory Clinical Findings  CBC  Recent Labs  04/11/15 0420 04/12/15 0433  WBC 6.1 6.9  HGB 12.7* 12.9*  HCT 38.9* 39.1  MCV 92.8 92.7  PLT 215 219   Basic Metabolic Panel  Recent Labs  04/11/15 0420  NA 141  K 4.0  CL 102  CO2 29  GLUCOSE 176*  BUN 18  CREATININE 0.91  CALCIUM 8.1*    TELE Wandering baseline; regular rhythm at 60     Radiology/Studies  Ct Coronary Morp W/cta Cor W/score W/ca W/cm &/or Wo/cm  04/11/2015  ADDENDUM REPORT: 04/11/2015 17:26 CLINICAL DATA:  Aortic stenosis EXAM: Cardiac TAVR CT TECHNIQUE: The patient was scanned on a Philips 256 scanner. A 120 kV retrospective scan was triggered in the descending thoracic aorta at 111 HU's. Gantry rotation speed was 270 msecs and collimation was .9 mm. No beta blockade or nitro were given. The 3D data set was reconstructed in 5% intervals of the R-R cycle. Systolic and diastolic phases were analyzed on a dedicated work station using  MPR, MIP and VRT modes. The patient received 80 cc of contrast. FINDINGS: Aortic Valve: Tri-leaflet and moderately calcified Mild MAC. No LAA thrombus Aorta: Mild root dilatation. Nearly circumferential calcification of the STJ. Moderate calcification of the arch with no atheroma and normal origin of the great vessels Sinotubular Junction:  30 mm Ascending Thoracic Aorta:  37 mm Aortic Arch:  32 mm Descending Thoracic Aorta:  28 mm Sinus of Valsalva Measurements: Non-coronary:  33 mm Right -coronary:  31 mm Left -coronary:  34 mm Coronary Artery Height above Annulus: Left Main:  14 mm Right Coronary:  16 mm Virtual Basal Annulus Measurements: Maximum/Minimum Diameter:  29.8 x 22.5 mm Perimeter:  82 mm Area:  509 mm2 Coronary Arteries:  Sufficient height above annulus for deployment Optimum Fluoroscopic Angle for Delivery: RAO 2 degrees Cranial 0 degrees IMPRESSION: 1) Moderately calcified  tri-leaflet aortic valve with annulus 509 mm2 suitable for a 26 mm Sapien 3 valve 2) Sufficient coronary height for delivery 3) Near circumferential calcification of the STJ 4) Optimum angiographic angle for delivery RAO 2 degrees Cranial 0 degrees 5) No LAA thrombus 6) Moderate calcification of the aortic arch Charlton Haws Electronically Signed   By: Charlton Haws M.D.   On: 04/11/2015 17:26  04/11/2015  EXAM: OVER-READ INTERPRETATION  CT CHEST The following report is an over-read performed by radiologist Dr. Royal Piedra [facility] on 04/11/2015. This over-read does not include interpretation of cardiac or coronary anatomy or pathology. The coronary calcium score/coronary CTA interpretation by the cardiologist is attached. COMPARISON:  No priors. FINDINGS: Extracardiac findings will be dictated under separate accession number for contemporaneously obtained CTA of the chest, abdomen and pelvis obtained on 04/11/2015. IMPRESSION: See separate dictation for extracardiac findings condyle reported on contemporaneously obtained CTA of the chest, abdomen and pelvis dated 04/11/2015. Electronically Signed: By: Trudie Reed M.D. On: 04/11/2015 14:45   Ct Angio Chest Aorta W/cm &/or Wo/cm  04/11/2015  CLINICAL DATA:  82 year old male with history of severe aortic stenosis. Preprocedural study prior to potential transcatheter aortic valve replacement (TAVR). EXAM: CT ANGIOGRAPHY CHEST, ABDOMEN AND PELVIS TECHNIQUE: Multidetector CT imaging through the chest, abdomen and pelvis was performed using the standard protocol during bolus administration of intravenous contrast. Multiplanar reconstructed images and MIPs were obtained and reviewed to evaluate the vascular anatomy. CONTRAST:  75mL OMNIPAQUE IOHEXOL 350 MG/ML SOLN, 75mL OMNIPAQUE IOHEXOL 350 MG/ML SOLN COMPARISON:  No priors. FINDINGS: CTA CHEST FINDINGS Mediastinum/Lymph Nodes: Heart size is enlarged with left ventricular and left atrial  dilatation. There is no significant pericardial fluid, thickening or pericardial calcification. There is atherosclerosis of the thoracic aorta, the great vessels of the mediastinum and the coronary arteries, including calcified atherosclerotic plaque in the left main, left anterior descending, left circumflex and right coronary arteries. Status post median sternotomy for CABG, including LIMA to the LAD. There is also a saphenous vein graft to the obtuse marginal distribution. Severe thickening calcification of the aortic valve. Calcification of the mitral annulus. No pathologically enlarged mediastinal or hilar lymph nodes. Esophagus is unremarkable in appearance. No axillary lymphadenopathy. Lungs/Pleura: Moderate bilateral pleural effusions are simple in appearance layering dependently. These are associated with areas of passive atelectasis in the lower lobes of the lungs bilaterally. No acute consolidative airspace disease. There are some patchy areas of ground-glass attenuation and interlobular septal thickening, favored to reflect a background of very mild interstitial pulmonary edema. No suspicious appearing pulmonary nodules or masses are noted in the well aerated portions of  the lungs. Musculoskeletal/Soft Tissues: Median sternotomy wires. Healing nondisplaced fractures of the right fourth, fifth and sixth ribs anteriorly, as well as the right seventh rib laterally. There are no aggressive appearing lytic or blastic lesions noted in the visualized portions of the skeleton. CTA ABDOMEN AND PELVIS FINDINGS Hepatobiliary: The liver has a slightly shrunken appearance and nodular contour, suggestive of underlying cirrhosis. In the central aspect of the right lobe of the liver between segments 7 and 8 (image 133 of series 401) there is a somewhat ill-defined hypervascular area estimated to measure 1.9 x 2.3 cm. Immediately adjacent to this extending superiorly from this area there is a branching hypodensity, which  appears likely to be within the distribution of the right hepatic vein. Findings are concerning for potential small hepatocellular carcinoma with associated tumor thrombus in the right hepatic vein. There is an additional ill-defined hypervascular area in the inferior aspect of segment 5 of the liver measuring 1.7 x 2.8 cm (image 183 of series 401). No intra or extrahepatic biliary ductal dilatation. Numerous calcified gallstones lie dependently in the gallbladder. No current findings to suggest an acute cholecystitis at this time. Pancreas: No pancreatic mass. No pancreatic ductal dilatation. No pancreatic or peripancreatic fluid or inflammatory changes. Spleen: Unremarkable. Adrenals/Urinary Tract: Bilateral adrenal glands are normal in appearance. Tiny simple cysts in the upper pole of the left kidney. Sub cm low-attenuation lesion in the upper pole the right kidney is too small to definitively characterize, but is also favored to represent a small cyst. No hydroureteronephrosis or perinephric stranding to indicate urinary tract obstruction at this time. Urinary bladder is normal in appearance. Stomach/Bowel: Normal appearance of the stomach. No pathologic dilatation of small bowel or colon. The appendix is not confidently identified may be surgically absent. Regardless, there are no inflammatory changes noted adjacent to the cecum to suggest presence of an acute appendicitis at this time. Vascular/Lymphatic: Extensive atherosclerosis throughout the abdominal and pelvic vasculature, with vascular findings and measurements pertinent to potential TAVR procedure, as detailed below. No aneurysm or dissection identified in the abdominal or pelvic vasculature. No lymphadenopathy noted in the abdomen or pelvis. Reproductive: Prostate gland and seminal vesicles are unremarkable in appearance. Other: No significant volume of ascites.  No pneumoperitoneum. Musculoskeletal: There are no aggressive appearing lytic or blastic  lesions noted in the visualized portions of the skeleton. VASCULAR MEASUREMENTS PERTINENT TO TAVR: AORTA: Minimal Aortic Diameter -  12.7 x 13.8 mm Severity of Aortic Calcification -  moderate to severe RIGHT PELVIS: Right Common Iliac Artery - Minimal Diameter - 9.1 x 7.8 mm Tortuosity - mild Calcification - moderate to severe Right External Iliac Artery - Minimal Diameter - 8.6 x 8.3 mm Tortuosity - severe Calcification - none Right Common Femoral Artery - Minimal Diameter - 7.9 x 6.0 mm Tortuosity - mild Calcification - mild LEFT PELVIS: Left Common Iliac Artery - Minimal Diameter - 8.0 x 8.8 mm Tortuosity - mild Calcification - moderate to severe Left External Iliac Artery - Minimal Diameter - 8.4 x 8.1 mm Tortuosity - mild Calcification - none Left Common Femoral Artery - Minimal Diameter - 7.8 x 6.5 mm Tortuosity - mild Calcification - mild Review of the MIP images confirms the above findings. IMPRESSION: 1. Vascular findings and measurements pertinent to potential TAVR procedure, as detailed above. This patient does appear to have suitable pelvic arterial access bilaterally, however, left-sided vascular approach is considered far more optimal given the less severe tortuosity of the left external iliac artery. 2. Morphologic  changes in the liver strongly suggestive of cirrhosis, with suspicious hypervascular area in the right lobe of the liver which is concerning for potential hepatocellular carcinoma. This appears to be associated with potential tumor thrombus in branches of the right hepatic vein, as detailed above. There is an additional small hypervascular area in segment 5 of the liver as well. Further evaluation of these findings with MRI of the abdomen with and without IV gadolinium (preferably Eovist) is strongly recommended in the near future. 3. The appearance of the chest suggests mild congestive heart failure, including mild interstitial pulmonary edema and moderate bilateral pleural effusions. 4.  Thickening calcification of the aortic valve, compatible with the reported clinical history of severe aortic stenosis. 5. Cardiomegaly with left ventricular and left atrial dilatation. 6. Cholelithiasis without evidence of acute cholecystitis at this time. 7. Multiple healing nondisplaced right-sided rib fractures, as above. 8. Additional incidental findings, as above. These results were called by telephone at the time of interpretation on 04/11/2015 at 4:10 pm to Dr. Tonny Bollman, who verbally acknowledged these results. Electronically Signed   By: Trudie Reed M.D.   On: 04/11/2015 16:15   Ct Angio Abd/pel W/ And/or W/o  04/11/2015  CLINICAL DATA:  82 year old male with history of severe aortic stenosis. Preprocedural study prior to potential transcatheter aortic valve replacement (TAVR). EXAM: CT ANGIOGRAPHY CHEST, ABDOMEN AND PELVIS TECHNIQUE: Multidetector CT imaging through the chest, abdomen and pelvis was performed using the standard protocol during bolus administration of intravenous contrast. Multiplanar reconstructed images and MIPs were obtained and reviewed to evaluate the vascular anatomy. CONTRAST:  75mL OMNIPAQUE IOHEXOL 350 MG/ML SOLN, 75mL OMNIPAQUE IOHEXOL 350 MG/ML SOLN COMPARISON:  No priors. FINDINGS: CTA CHEST FINDINGS Mediastinum/Lymph Nodes: Heart size is enlarged with left ventricular and left atrial dilatation. There is no significant pericardial fluid, thickening or pericardial calcification. There is atherosclerosis of the thoracic aorta, the great vessels of the mediastinum and the coronary arteries, including calcified atherosclerotic plaque in the left main, left anterior descending, left circumflex and right coronary arteries. Status post median sternotomy for CABG, including LIMA to the LAD. There is also a saphenous vein graft to the obtuse marginal distribution. Severe thickening calcification of the aortic valve. Calcification of the mitral annulus. No pathologically  enlarged mediastinal or hilar lymph nodes. Esophagus is unremarkable in appearance. No axillary lymphadenopathy. Lungs/Pleura: Moderate bilateral pleural effusions are simple in appearance layering dependently. These are associated with areas of passive atelectasis in the lower lobes of the lungs bilaterally. No acute consolidative airspace disease. There are some patchy areas of ground-glass attenuation and interlobular septal thickening, favored to reflect a background of very mild interstitial pulmonary edema. No suspicious appearing pulmonary nodules or masses are noted in the well aerated portions of the lungs. Musculoskeletal/Soft Tissues: Median sternotomy wires. Healing nondisplaced fractures of the right fourth, fifth and sixth ribs anteriorly, as well as the right seventh rib laterally. There are no aggressive appearing lytic or blastic lesions noted in the visualized portions of the skeleton. CTA ABDOMEN AND PELVIS FINDINGS Hepatobiliary: The liver has a slightly shrunken appearance and nodular contour, suggestive of underlying cirrhosis. In the central aspect of the right lobe of the liver between segments 7 and 8 (image 133 of series 401) there is a somewhat ill-defined hypervascular area estimated to measure 1.9 x 2.3 cm. Immediately adjacent to this extending superiorly from this area there is a branching hypodensity, which appears likely to be within the distribution of the right hepatic vein. Findings are  concerning for potential small hepatocellular carcinoma with associated tumor thrombus in the right hepatic vein. There is an additional ill-defined hypervascular area in the inferior aspect of segment 5 of the liver measuring 1.7 x 2.8 cm (image 183 of series 401). No intra or extrahepatic biliary ductal dilatation. Numerous calcified gallstones lie dependently in the gallbladder. No current findings to suggest an acute cholecystitis at this time. Pancreas: No pancreatic mass. No pancreatic ductal  dilatation. No pancreatic or peripancreatic fluid or inflammatory changes. Spleen: Unremarkable. Adrenals/Urinary Tract: Bilateral adrenal glands are normal in appearance. Tiny simple cysts in the upper pole of the left kidney. Sub cm low-attenuation lesion in the upper pole the right kidney is too small to definitively characterize, but is also favored to represent a small cyst. No hydroureteronephrosis or perinephric stranding to indicate urinary tract obstruction at this time. Urinary bladder is normal in appearance. Stomach/Bowel: Normal appearance of the stomach. No pathologic dilatation of small bowel or colon. The appendix is not confidently identified may be surgically absent. Regardless, there are no inflammatory changes noted adjacent to the cecum to suggest presence of an acute appendicitis at this time. Vascular/Lymphatic: Extensive atherosclerosis throughout the abdominal and pelvic vasculature, with vascular findings and measurements pertinent to potential TAVR procedure, as detailed below. No aneurysm or dissection identified in the abdominal or pelvic vasculature. No lymphadenopathy noted in the abdomen or pelvis. Reproductive: Prostate gland and seminal vesicles are unremarkable in appearance. Other: No significant volume of ascites.  No pneumoperitoneum. Musculoskeletal: There are no aggressive appearing lytic or blastic lesions noted in the visualized portions of the skeleton. VASCULAR MEASUREMENTS PERTINENT TO TAVR: AORTA: Minimal Aortic Diameter -  12.7 x 13.8 mm Severity of Aortic Calcification -  moderate to severe RIGHT PELVIS: Right Common Iliac Artery - Minimal Diameter - 9.1 x 7.8 mm Tortuosity - mild Calcification - moderate to severe Right External Iliac Artery - Minimal Diameter - 8.6 x 8.3 mm Tortuosity - severe Calcification - none Right Common Femoral Artery - Minimal Diameter - 7.9 x 6.0 mm Tortuosity - mild Calcification - mild LEFT PELVIS: Left Common Iliac Artery - Minimal  Diameter - 8.0 x 8.8 mm Tortuosity - mild Calcification - moderate to severe Left External Iliac Artery - Minimal Diameter - 8.4 x 8.1 mm Tortuosity - mild Calcification - none Left Common Femoral Artery - Minimal Diameter - 7.8 x 6.5 mm Tortuosity - mild Calcification - mild Review of the MIP images confirms the above findings. IMPRESSION: 1. Vascular findings and measurements pertinent to potential TAVR procedure, as detailed above. This patient does appear to have suitable pelvic arterial access bilaterally, however, left-sided vascular approach is considered far more optimal given the less severe tortuosity of the left external iliac artery. 2. Morphologic changes in the liver strongly suggestive of cirrhosis, with suspicious hypervascular area in the right lobe of the liver which is concerning for potential hepatocellular carcinoma. This appears to be associated with potential tumor thrombus in branches of the right hepatic vein, as detailed above. There is an additional small hypervascular area in segment 5 of the liver as well. Further evaluation of these findings with MRI of the abdomen with and without IV gadolinium (preferably Eovist) is strongly recommended in the near future. 3. The appearance of the chest suggests mild congestive heart failure, including mild interstitial pulmonary edema and moderate bilateral pleural effusions. 4. Thickening calcification of the aortic valve, compatible with the reported clinical history of severe aortic stenosis. 5. Cardiomegaly with left ventricular and  left atrial dilatation. 6. Cholelithiasis without evidence of acute cholecystitis at this time. 7. Multiple healing nondisplaced right-sided rib fractures, as above. 8. Additional incidental findings, as above. These results were called by telephone at the time of interpretation on 04/11/2015 at 4:10 pm to Dr. Tonny Bollman, who verbally acknowledged these results. Electronically Signed   By: Trudie Reed M.D.    On: 04/11/2015 16:15    ASSESSMENT AND PLAN  Wolfe Camarena is a 82 y.o. male with past medical history of CAD (s/p 3-vessel CABG in 2002), HTN, paroxysmal atrial fibrillation (not on anticoagulation since 02/2015), HLD, chronic systolic CHF (EF 60-45% by echo in May 2016), and moderate to severe aortic stenosis who presents to [facility] on 04/07/2015 as a transfer from [facility] for chest pain. (see H&P for recent evaluation of AS and fall hx)  1. Atypical chest pain/Elevated troponin - Transfer from [facility] for further evaluation of atypical chest pain and minimally abnormal troponin I (0.036). - Troponin 0.06-->0.06-->0.05. Continued IV heparin.  - Resolved chest pain. Continue ASA, Norvasc, statin and imdur.   2. CAD - CAD, s/p 3-vessel bypass in 2002.  - Reviewed cath report 02/19/2015 -->severe diffuse coronary disease with adequate perfusion to all area of the heart except first diagonal (totally occluded) which has patent graft. Moderate AS. - s/p dobutamine stress echo, moderate myocardial recruitment with IV dobutamine with almost all wall segments at least partly viable, with the exception of the mid-distal anterior septum, which may be mostly scar  3. Paroxysmal Atrial Fibrillation - Maintaining sinus rhythm with PACs and PVcs - This patients CHA2DS2-VASc Score and unadjusted Ischemic Stroke Rate (% per year) is equal to 9.7 % stroke rate/year from a score of 6 (CHF, HTN, DM, Vascular, Age (2). Was on Eliquis in 02/2015. This was discontinued due to anemia and a fall. Hgb has been stable.  - Continue Sotalol for rate control.  - 4. HLD - continue statin  5.Severe Aortic Stenosis - echo this admission shows an ejection fraction of 15% with diffuse hypokinesis and grade 3 diastolic dysfunction. He was felt to have severe low gradient low output aortic valve stenosis with a mean gradient of 14. The left atrium was severely dilated and there was mild dilatation  of the RA. His EF was 50% 6 weeks ago during cath. Seen by Dr. Laneta Simmers and Dr. Excell Seltzer   6. Chronic Systolic CHF - EF 45-50% by echo in 07/2014. - Recent echo as above.   7. HTN - Stable and well controlled.   8. DM - Continue SSI  ----------------------------------------------------------------------------- FINDINGS: CTA CHEST FINDINGS  Mediastinum/Lymph Nodes: Heart size is enlarged with left ventricular and left atrial dilatation. There is no significant pericardial fluid, thickening or pericardial calcification. There is atherosclerosis of the thoracic aorta, the great vessels of the mediastinum and the coronary arteries, including calcified atherosclerotic plaque in the left main, left anterior descending, left circumflex and right coronary arteries. Status post median sternotomy for CABG, including LIMA to the LAD. There is also a saphenous vein graft to the obtuse marginal distribution. Severe thickening calcification of the aortic valve. Calcification of the mitral annulus. No pathologically enlarged mediastinal or hilar lymph nodes. Esophagus is unremarkable in appearance. No axillary lymphadenopathy.  Lungs/Pleura: Moderate bilateral pleural effusions are simple in appearance layering dependently. These are associated with areas of passive atelectasis in the lower lobes of the lungs bilaterally. No acute consolidative airspace disease. There are some patchy areas of ground-glass attenuation and interlobular  septal thickening, favored to reflect a background of very mild interstitial pulmonary edema. No suspicious appearing pulmonary nodules or masses are noted in the well aerated portions of the lungs.  Musculoskeletal/Soft Tissues: Median sternotomy wires. Healing nondisplaced fractures of the right fourth, fifth and sixth ribs anteriorly, as well as the right seventh rib laterally. There are no aggressive appearing lytic or blastic lesions noted in  the visualized portions of the skeleton.  CTA ABDOMEN AND PELVIS FINDINGS  Hepatobiliary: The liver has a slightly shrunken appearance and nodular contour, suggestive of underlying cirrhosis. In the central aspect of the right lobe of the liver between segments 7 and 8 (image 133 of series 401) there is a somewhat ill-defined hypervascular area estimated to measure 1.9 x 2.3 cm. Immediately adjacent to this extending superiorly from this area there is a branching hypodensity, which appears likely to be within the distribution of the right hepatic vein. Findings are concerning for potential small hepatocellular carcinoma with associated tumor thrombus in the right hepatic vein. There is an additional ill-defined hypervascular area in the inferior aspect of segment 5 of the liver measuring 1.7 x 2.8 cm (image 183 of series 401). No intra or extrahepatic biliary ductal dilatation. Numerous calcified gallstones lie dependently in the gallbladder. No current findings to suggest an acute cholecystitis at this time.  Pancreas: No pancreatic mass. No pancreatic ductal dilatation. No pancreatic or peripancreatic fluid or inflammatory changes.  Spleen: Unremarkable.  Adrenals/Urinary Tract: Bilateral adrenal glands are normal in appearance. Tiny simple cysts in the upper pole of the left kidney. Sub cm low-attenuation lesion in the upper pole the right kidney is too small to definitively characterize, but is also favored to represent a small cyst. No hydroureteronephrosis or perinephric stranding to indicate urinary tract obstruction at this time. Urinary bladder is normal in appearance.  Stomach/Bowel: Normal appearance of the stomach. No pathologic dilatation of small bowel or colon. The appendix is not confidently identified may be surgically absent. Regardless, there are no inflammatory changes noted adjacent to the cecum to suggest presence of an acute appendicitis at this  time.  Vascular/Lymphatic: Extensive atherosclerosis throughout the abdominal and pelvic vasculature, with vascular findings and measurements pertinent to potential TAVR procedure, as detailed below. No aneurysm or dissection identified in the abdominal or pelvic vasculature. No lymphadenopathy noted in the abdomen or pelvis.  Reproductive: Prostate gland and seminal vesicles are unremarkable in appearance.  Other: No significant volume of ascites. No pneumoperitoneum.  Musculoskeletal: There are no aggressive appearing lytic or blastic lesions noted in the visualized portions of the skeleton.  VASCULAR MEASUREMENTS PERTINENT TO TAVR:  AORTA:  Minimal Aortic Diameter - 12.7 x 13.8 mm  Severity of Aortic Calcification - moderate to severe  RIGHT PELVIS:  Right Common Iliac Artery -  Minimal Diameter - 9.1 x 7.8 mm  Tortuosity - mild  Calcification - moderate to severe  Right External Iliac Artery -  Minimal Diameter - 8.6 x 8.3 mm  Tortuosity - severe  Calcification - none  Right Common Femoral Artery -  Minimal Diameter - 7.9 x 6.0 mm  Tortuosity - mild  Calcification - mild  LEFT PELVIS:  Left Common Iliac Artery -  Minimal Diameter - 8.0 x 8.8 mm  Tortuosity - mild  Calcification - moderate to severe  Left External Iliac Artery -  Minimal Diameter - 8.4 x 8.1 mm  Tortuosity - mild  Calcification - none  Left Common Femoral Artery -  Minimal Diameter - 7.8 x  6.5 mm  Tortuosity - mild  Calcification - mild  Review of the MIP images confirms the above findings.  IMPRESSION: 1. Vascular findings and measurements pertinent to potential TAVR procedure, as detailed above. This patient does appear to have suitable pelvic arterial access bilaterally, however, left-sided vascular approach is considered far more optimal given the less severe tortuosity of the left external iliac artery. 2. Morphologic  changes in the liver strongly suggestive of cirrhosis, with suspicious hypervascular area in the right lobe of the liver which is concerning for potential hepatocellular carcinoma. This appears to be associated with potential tumor thrombus in branches of the right hepatic vein, as detailed above. There is an additional small hypervascular area in segment 5 of the liver as well. Further evaluation of these findings with MRI of the abdomen with and without IV gadolinium (preferably Eovist) is strongly recommended in the near future. 3. The appearance of the chest suggests mild congestive heart failure, including mild interstitial pulmonary edema and moderate bilateral pleural effusions. 4. Thickening calcification of the aortic valve, compatible with the reported clinical history of severe aortic stenosis. 5. Cardiomegaly with left ventricular and left atrial dilatation. 6. Cholelithiasis without evidence of acute cholecystitis at this time. 7. Multiple healing nondisplaced right-sided rib fractures, as above. 8. Additional incidental findings, as above. These results were called by telephone at the time of interpretation on 04/11/2015 at 4:10 pm to Dr. Tonny Bollman, who verbally acknowledged these results.   Electronically Signed  By: Trudie Reed M.D.  On: 04/11/2015 16:15 ------------------------------------------------------------------------  Patient denies any chest pain or shortness of breath. Currently maintaining sinus rhythm but bradycardic. He will complete his TAVR evaluation today when seen by Dr. Tyrone Sage for required second surgical evaluation.   The patient will then be discharged today after seen by Dr. Beckie Salts with plans for surgery on February 7,2017.    Lennette Bihari, MD, [facility] 04/12/2015 1:07 PM

## 2015-04-12 NOTE — Consult Note (Signed)
[address]       [place] [address]             [phone]        Marshaun Lortie [facility] Medical Record #[id number] Date of Birth: 1928/06/11  Referring:Dr Excell Seltzer Primary Care: [name], MD Cardiologist:Dr. [name] in [place] Chief Complaint:  2nd opinion  for TAVER  History of Present Illness:     The patient is an 82 year old g from [facility], [place] with a history of hypertension, hyperlipidemia, type 2 DM, PAF on Eliquis, coronary artery disease, s/p CABG x 3 in 2002 and known moderate to severe aortic stenosis. The patient's primary cardiologist is Dr. [name] in [place].  He reports his problems starting in May 2016 when he was diagnosed with new onset atrial fibrillation. About that time he began having some exertional fatigue, shortness of breath and episodes of dizziness.He underwent an echo sometime last summer that reportedly showed moderate to severe AS with a mean AV gradient of 28 mm Hg and a peak of 52 mm Hg with an AVA of 1 cm2. The EF at that time was 45-50%. From the cardiology notes it was felt that his symptoms were likely due to the aortic stenosis but he was not felt to be a candidate for TAVR since his mean gradient was only 28 mm Hg. His symptoms of dizziness and exertional fatigue/SOB continued to progress and he also noted some episodes of intermittent chest pain. Reading through the notes from [place] it sounds like another echo was done around November 2016 which again showed moderate to severe AS with a mean AV gradient of 21 mm Hg and a peak of 44 mm Hg with an AVA of 0.93 cm2. His symptoms were still felt to be due to his AS but he was still not felt to be a TAVR candidate because his peak velocity was only 3.3 m/sec. A cath was done in December to see if he had recurrent coronary or graft stenoses for consideration of open surgical AVR and CABG. The cath report shows a mean AV gradient of 19 mm Hg with a calculated AVA of 1.2 cm2.  LVEDP was 18. CI was 2.3 by thermodilution and 2.8 by Fick. Coronary angiography showed severe diffuse coronary disease with patent LIMA to the LAD, patent SVG's to the LCX and Diagonal. The LV showed normal wall motion with an EF of 50%.  He had new chest pain early this week and went to  [facility] with chest pain and was noted to have a mildly elevated troponin of 0.06. An attempt was made to transfer him to [facility] in [place] or [facility]  but there were no beds and he eventually was transferred to [facility] ([place] for further evaluation. His Troponin has remained 0.06 and ECG showed sinus rhythm with non-specified intraventricular block. An echo today showed an LVEF of 15-20% with diffuse hypokinesis, grade 3 diastolic dysfunction. The aortic valve is trileaflet and severely thickened and calcified with severely reduced cusp separation consistent with severe AS. The mean gradient was 14 mm Hg with a DI of 0.26. There is moderate MR with a severely dilated LA. The RV appeared normal.  The patient says that he does ok walking around in the house but has been sedentary recently. He can't do any strenuous activity. He has poor appetite and has lost 25 lbs over the past year. He has had bilateral ankle edema.   Current  Activity/ Functional Status: Patient is independent with mobility/ambulation, transfers, ADL's, IADL's.   Zubrod Score: At the time of surgery this patient's most appropriate activity status/level should be described as:     0    Normal activity, no symptoms     1    Restricted in physical strenuous activity but ambulatory, able to do out light work     2    Ambulatory and capable of self care, unable to do work activities, up and about                 more than 50%  Of the time                                3    Only limited self care, in bed greater than 50% of waking hours     4    Completely disabled, no self care, confined to bed or chair     5    Moribund  Past  Medical History  Diagnosis Date  . CAD (coronary artery disease)     Multivessel status post CABG 2002  . Essential hypertension   . Depression   . Type 2 diabetes mellitus (HCC)   . PAF (paroxysmal atrial fibrillation) (HCC)     On Sotalol (previously Eliquis - stopped 02/2015)  . Aortic stenosis     Moderate to severe May 2016  . Vitamin D deficiency   . Diabetic nephropathy (HCC)   . BPH (benign prostatic hyperplasia)   . GERD (gastroesophageal reflux disease)   . Chronic systolic heart failure (HCC)     LVEF 45-50% May 2016  . Medication intolerance     Amiodarone - nausea and emesis  . History of fall     Right wrist fracture and facial contusion 02/2015, possibly associated with orthostasis  . Iron deficiency anemia     Past Surgical History  Procedure Laterality Date  . Cataract surgery Bilateral   . Coronary artery bypass graft  2002     [facility]  . Adenoidectomy, tonsillectomy and myringotomy with tube placement      History  Smoking status  . Former Smoker  . Types: Cigarettes  . Quit date: 03/09/1959  Smokeless tobacco  . Not on file    History  Alcohol Use No    Social History   Social History  . Marital Status: Married    Spouse Name: N/A  . Number of Children: N/A  . Years of Education: N/A   Occupational History  . Not on file.   Social History Main Topics  . Smoking status: Former Smoker    Types: Cigarettes    Quit date: 03/09/1959  . Smokeless tobacco: Not on file  . Alcohol Use: No  . Drug Use: No  . Sexual Activity: Not on file   Other Topics Concern  . Not on file   Social History Narrative    Allergies  Allergen Reactions  . Penicillins Rash    Has patient had a PCN reaction causing immediate rash, facial/tongue/throat swelling, SOB or lightheadedness with hypotension: Yes Has patient had a PCN reaction causing severe rash involving mucus membranes or skin necrosis: No Has patient had a PCN reaction  that required hospitalization No Has patient had a PCN reaction occurring within the last 10 years: No If all of the above answers are "NO", then may proceed with Cephalosporin use.  Current Facility-Administered Medications  Medication Dose Route Frequency Provider Last Rate Last Dose  . acetaminophen (TYLENOL) tablet 650 mg  650 mg Oral Q4H PRN Ellsworth Lennox, PA      . amLODipine (NORVASC) tablet 5 mg  5 mg Oral Daily Bhavinkumar Bhagat, PA   5 mg at 04/12/15 1052  . antiseptic oral rinse (CPC / CETYLPYRIDINIUM CHLORIDE 0.05%) solution 7 mL  7 mL Mouth Rinse BID Lennette Bihari, MD   7 mL at 04/12/15 1000  . aspirin EC tablet 81 mg  81 mg Oral Daily Ellsworth Lennox, [place]   81 mg at 04/12/15 1052  . atorvastatin (LIPITOR) tablet 40 mg  40 mg Oral q1800 Ellsworth Lennox, [place]   40 mg at 04/11/15 1727  . DOBUTamine (DOBUTREX) 1,000 mcg/mL in dextrose 5% 250 mL infusion  0-40 mcg/kg/min Intravenous Continuous Bhavinkumar Bhagat, PA 0 mL/hr at 04/10/15 1145 0 mcg/kg/min at 04/10/15 1145  . DULoxetine (CYMBALTA) DR capsule 60 mg  60 mg Oral Daily Ellsworth Lennox, [place]   60 mg at 04/12/15 1052  . feeding supplement (ENSURE ENLIVE) (ENSURE ENLIVE) liquid 237 mL  237 mL Oral BID BM Lennette Bihari, MD   237 mL at 04/12/15 1052  . feeding supplement (GLUCERNA SHAKE) (GLUCERNA SHAKE) liquid 237 mL  237 mL Oral TID BM Idell Pickles, RD   237 mL at 04/12/15 1052  . furosemide (LASIX) tablet 20 mg  20 mg Oral Daily Ellsworth Lennox, [place]   20 mg at 04/12/15 1052  . heparin ADULT infusion 100 units/mL (25000 units/250 mL)  1,000 Units/hr Intravenous Continuous Signe Colt, RPH 10 mL/hr at 04/12/15 1056 1,000 Units/hr at 04/12/15 1056  . hydrALAZINE (APRESOLINE) injection 5 mg  5 mg Intravenous Q8H PRN Ellsworth Lennox, PA   5 mg at 04/07/15 2107  . insulin aspart (novoLOG) injection 0-15 Units  0-15 Units Subcutaneous TID [facility] Ellsworth Lennox, [place]   5 Units at 04/12/15 1238  . isosorbide  mononitrate (IMDUR) 24 hr tablet 30 mg  30 mg Oral Daily Bhavinkumar Bhagat, PA   30 mg at 04/12/15 1052  . nitroGLYCERIN (NITROSTAT) SL tablet 0.4 mg  0.4 mg Sublingual Q5 Min x 3 PRN Ellsworth Lennox, PA   0.4 mg at 04/08/15 0036  . ondansetron (ZOFRAN) injection 4 mg  4 mg Intravenous Q6H PRN Ellsworth Lennox, PA      . pantoprazole (PROTONIX) EC tablet 40 mg  40 mg Oral Daily Ellsworth Lennox, [place]   40 mg at 04/12/15 1052  . sotalol (BETAPACE) tablet 120 mg  120 mg Oral Daily Ellsworth Lennox, [place]   120 mg at 04/12/15 1056    Prescriptions prior to admission  Medication Sig Dispense Refill Last Dose  . aspirin EC 81 MG tablet Take 81 mg by mouth daily.   04/07/2015 at Unknown time  . atorvastatin (LIPITOR) 40 MG tablet Take 40 mg by mouth daily.   04/07/2015 at Unknown time  . DULoxetine (CYMBALTA) 60 MG capsule Take 120 mg by mouth daily.   04/07/2015 at Unknown time  . furosemide (LASIX) 20 MG tablet Take 20 mg by mouth daily as needed for fluid or edema.   04/07/2015 at Unknown time  . glimepiride (AMARYL) 4 MG tablet Take 4 mg by mouth daily with breakfast.   04/07/2015 at Unknown time  . metFORMIN (GLUCOPHAGE) 1000 MG tablet Take 1,000 mg by mouth 2 (two) times daily with a  meal.   04/07/2015 at Unknown time  . omeprazole (PRILOSEC) 20 MG capsule Take 20 mg by mouth daily.   04/07/2015 at Unknown time  . sotalol (BETAPACE) 120 MG tablet Take 120 mg by mouth daily.   04/07/2015 at 0800    Family History  Problem Relation Age of Onset  . Heart attack Father   . Hypertension Father   . Diabetes Mellitus II Brother      Review of Systems:      Cardiac Review of Systems: Y or N  Chest Pain [ y ]  Resting SOB [ y  ] Exertional SOB  [ y ]  89 [ y ]   Pedal Edema [ n  ]    Palpitations [ n ] Syncope  [ n ]   Presyncope [n   ]  General Review of Systems: [Y] = yes [  ]=no Constitional: recent weight change [  ]; anorexia [  ]; fatigue [  ]; nausea [  ]; night sweats [  ]; fever  [  ]; or chills [  ]                                                               Dental: poor dentition[  ]; Last Dentist visit:   Eye : blurred vision [  ]; diplopia [   ]; vision changes [  ];  Amaurosis fugax[  ]; Resp: cough [  ];  wheezing[  ];  hemoptysis[  ]; shortness of breath[  ]; paroxysmal nocturnal dyspnea[  ]; dyspnea on exertion[  ]; or orthopnea[  ];  GI:  gallstones[  ], vomiting[  ];  dysphagia[  ]; melena[  ];  hematochezia [  ]; heartburn[  ];   Hx of  Colonoscopy[  ]; GU: kidney stones [  ]; hematuria[  ];   dysuria [  ];  nocturia[  ];  history of     obstruction [  ]; urinary frequency [  ]             Skin: rash, swelling[  ];, hair loss[  ];  peripheral edema[  ];  or itching[  ]; Musculosketetal: myalgias[  ];  joint swelling[  ];  joint erythema[  ];  joint pain[  ];  back pain[  ];  Heme/Lymph: bruising[  ];  bleeding[  ];  anemia[  ];  Neuro: TIA[  ];  headaches[  ];  stroke[  ];  vertigo[  ];  seizures[  ];   paresthesias[  ];  difficulty walking[  ];  Psych:depression[  ]; anxiety[  ];  Endocrine: diabetes[  ];  thyroid dysfunction[  ];  Immunizations: Flu [  ]; Pneumococcal[  ];  Other:  Physical Exam: BP 142/79 mmHg  Pulse 58  Temp(Src) 98 F (36.7 C) (Oral)  Resp 20  Ht 6\' 1"  (1.854 m)  Wt 154 lb 6.4 oz (70.035 kg)  BMI 20.37 kg/m2  SpO2 93%   General appearance: alert, cooperative, appears stated age and no distress Head: Normocephalic, without obvious abnormality, atraumatic Neck: no adenopathy, no carotid bruit, no JVD, supple, symmetrical, trachea midline and thyroid not enlarged, symmetric, no tenderness/mass/nodules Lymph nodes: Cervical, supraclavicular, and axillary nodes normal. Resp: clear to auscultation bilaterally Back: symmetric,  no curvature. ROM normal. No CVA tenderness. Cardio: regular rate and rhythm and systolic murmur: early systolic 2/6, crescendo at 2nd right intercostal space GI: soft, non-tender; bowel sounds normal; no  masses,  no organomegaly Extremities: extremities normal, atraumatic, no cyanosis or edema and Homans sign is negative, no sign of DVT Neurologic: Grossly normal  Diagnostic Studies & Laboratory data:     Recent Radiology Findings:   Ct Coronary Morp W/cta Cor W/score W/ca W/cm &/or Wo/cm  04/11/2015  ADDENDUM REPORT: 04/11/2015 17:26 CLINICAL DATA:  Aortic stenosis EXAM: Cardiac TAVR CT TECHNIQUE: The patient was scanned on a Philips 256 scanner. A 120 kV retrospective scan was triggered in the descending thoracic aorta at 111 HU's. Gantry rotation speed was 270 msecs and collimation was .9 mm. No beta blockade or nitro were given. The 3D data set was reconstructed in 5% intervals of the R-R cycle. Systolic and diastolic phases were analyzed on a dedicated work station using MPR, MIP and VRT modes. The patient received 80 cc of contrast. FINDINGS: Aortic Valve: Tri-leaflet and moderately calcified Mild MAC. No LAA thrombus Aorta: Mild root dilatation. Nearly circumferential calcification of the STJ. Moderate calcification of the arch with no atheroma and normal origin of the great vessels Sinotubular Junction:  30 mm Ascending Thoracic Aorta:  37 mm Aortic Arch:  32 mm Descending Thoracic Aorta:  28 mm Sinus of Valsalva Measurements: Non-coronary:  33 mm Right -coronary:  31 mm Left -coronary:  34 mm Coronary Artery Height above Annulus: Left Main:  14 mm Right Coronary:  16 mm Virtual Basal Annulus Measurements: Maximum/Minimum Diameter:  29.8 x 22.5 mm Perimeter:  82 mm Area:  509 mm2 Coronary Arteries:  Sufficient height above annulus for deployment Optimum Fluoroscopic Angle for Delivery: RAO 2 degrees Cranial 0 degrees IMPRESSION: 1) Moderately calcified tri-leaflet aortic valve with annulus 509 mm2 suitable for a 26 mm Sapien 3 valve 2) Sufficient coronary height for delivery 3) Near circumferential calcification of the STJ 4) Optimum angiographic angle for delivery RAO 2 degrees Cranial 0 degrees 5)  No LAA thrombus 6) Moderate calcification of the aortic arch Charlton Haws Electronically Signed   By: Charlton Haws M.D.   On: 04/11/2015 17:26  04/11/2015  EXAM: OVER-READ INTERPRETATION  CT CHEST The following report is an over-read performed by radiologist Dr. Royal Piedra [facility] on 04/11/2015. This over-read does not include interpretation of cardiac or coronary anatomy or pathology. The coronary calcium score/coronary CTA interpretation by the cardiologist is attached. COMPARISON:  No priors. FINDINGS: Extracardiac findings will be dictated under separate accession number for contemporaneously obtained CTA of the chest, abdomen and pelvis obtained on 04/11/2015. IMPRESSION: See separate dictation for extracardiac findings condyle reported on contemporaneously obtained CTA of the chest, abdomen and pelvis dated 04/11/2015. Electronically Signed: By: Trudie Reed M.D. On: 04/11/2015 14:45   Ct Angio Chest Aorta W/cm &/or Wo/cm  04/11/2015  CLINICAL DATA:  82 year old male with history of severe aortic stenosis. Preprocedural study prior to potential transcatheter aortic valve replacement (TAVR). EXAM: CT ANGIOGRAPHY CHEST, ABDOMEN AND PELVIS TECHNIQUE: Multidetector CT imaging through the chest, abdomen and pelvis was performed using the standard protocol during bolus administration of intravenous contrast. Multiplanar reconstructed images and MIPs were obtained and reviewed to evaluate the vascular anatomy. CONTRAST:  75mL OMNIPAQUE IOHEXOL 350 MG/ML SOLN, 75mL OMNIPAQUE IOHEXOL 350 MG/ML SOLN COMPARISON:  No priors. FINDINGS: CTA CHEST FINDINGS Mediastinum/Lymph Nodes: Heart size is enlarged with left ventricular and left atrial dilatation. There  is no significant pericardial fluid, thickening or pericardial calcification. There is atherosclerosis of the thoracic aorta, the great vessels of the mediastinum and the coronary arteries, including calcified atherosclerotic plaque in the  left main, left anterior descending, left circumflex and right coronary arteries. Status post median sternotomy for CABG, including LIMA to the LAD. There is also a saphenous vein graft to the obtuse marginal distribution. Severe thickening calcification of the aortic valve. Calcification of the mitral annulus. No pathologically enlarged mediastinal or hilar lymph nodes. Esophagus is unremarkable in appearance. No axillary lymphadenopathy. Lungs/Pleura: Moderate bilateral pleural effusions are simple in appearance layering dependently. These are associated with areas of passive atelectasis in the lower lobes of the lungs bilaterally. No acute consolidative airspace disease. There are some patchy areas of ground-glass attenuation and interlobular septal thickening, favored to reflect a background of very mild interstitial pulmonary edema. No suspicious appearing pulmonary nodules or masses are noted in the well aerated portions of the lungs. Musculoskeletal/Soft Tissues: Median sternotomy wires. Healing nondisplaced fractures of the right fourth, fifth and sixth ribs anteriorly, as well as the right seventh rib laterally. There are no aggressive appearing lytic or blastic lesions noted in the visualized portions of the skeleton. CTA ABDOMEN AND PELVIS FINDINGS Hepatobiliary: The liver has a slightly shrunken appearance and nodular contour, suggestive of underlying cirrhosis. In the central aspect of the right lobe of the liver between segments 7 and 8 (image 133 of series 401) there is a somewhat ill-defined hypervascular area estimated to measure 1.9 x 2.3 cm. Immediately adjacent to this extending superiorly from this area there is a branching hypodensity, which appears likely to be within the distribution of the right hepatic vein. Findings are concerning for potential small hepatocellular carcinoma with associated tumor thrombus in the right hepatic vein. There is an additional ill-defined hypervascular area in  the inferior aspect of segment 5 of the liver measuring 1.7 x 2.8 cm (image 183 of series 401). No intra or extrahepatic biliary ductal dilatation. Numerous calcified gallstones lie dependently in the gallbladder. No current findings to suggest an acute cholecystitis at this time. Pancreas: No pancreatic mass. No pancreatic ductal dilatation. No pancreatic or peripancreatic fluid or inflammatory changes. Spleen: Unremarkable. Adrenals/Urinary Tract: Bilateral adrenal glands are normal in appearance. Tiny simple cysts in the upper pole of the left kidney. Sub cm low-attenuation lesion in the upper pole the right kidney is too small to definitively characterize, but is also favored to represent a small cyst. No hydroureteronephrosis or perinephric stranding to indicate urinary tract obstruction at this time. Urinary bladder is normal in appearance. Stomach/Bowel: Normal appearance of the stomach. No pathologic dilatation of small bowel or colon. The appendix is not confidently identified may be surgically absent. Regardless, there are no inflammatory changes noted adjacent to the cecum to suggest presence of an acute appendicitis at this time. Vascular/Lymphatic: Extensive atherosclerosis throughout the abdominal and pelvic vasculature, with vascular findings and measurements pertinent to potential TAVR procedure, as detailed below. No aneurysm or dissection identified in the abdominal or pelvic vasculature. No lymphadenopathy noted in the abdomen or pelvis. Reproductive: Prostate gland and seminal vesicles are unremarkable in appearance. Other: No significant volume of ascites.  No pneumoperitoneum. Musculoskeletal: There are no aggressive appearing lytic or blastic lesions noted in the visualized portions of the skeleton. VASCULAR MEASUREMENTS PERTINENT TO TAVR: AORTA: Minimal Aortic Diameter -  12.7 x 13.8 mm Severity of Aortic Calcification -  moderate to severe RIGHT PELVIS: Right Common Iliac Artery - Minimal  Diameter - 9.1 x 7.8 mm Tortuosity - mild Calcification - moderate to severe Right External Iliac Artery - Minimal Diameter - 8.6 x 8.3 mm Tortuosity - severe Calcification - none Right Common Femoral Artery - Minimal Diameter - 7.9 x 6.0 mm Tortuosity - mild Calcification - mild LEFT PELVIS: Left Common Iliac Artery - Minimal Diameter - 8.0 x 8.8 mm Tortuosity - mild Calcification - moderate to severe Left External Iliac Artery - Minimal Diameter - 8.4 x 8.1 mm Tortuosity - mild Calcification - none Left Common Femoral Artery - Minimal Diameter - 7.8 x 6.5 mm Tortuosity - mild Calcification - mild Review of the MIP images confirms the above findings. IMPRESSION: 1. Vascular findings and measurements pertinent to potential TAVR procedure, as detailed above. This patient does appear to have suitable pelvic arterial access bilaterally, however, left-sided vascular approach is considered far more optimal given the less severe tortuosity of the left external iliac artery. 2. Morphologic changes in the liver strongly suggestive of cirrhosis, with suspicious hypervascular area in the right lobe of the liver which is concerning for potential hepatocellular carcinoma. This appears to be associated with potential tumor thrombus in branches of the right hepatic vein, as detailed above. There is an additional small hypervascular area in segment 5 of the liver as well. Further evaluation of these findings with MRI of the abdomen with and without IV gadolinium (preferably Eovist) is strongly recommended in the near future. 3. The appearance of the chest suggests mild congestive heart failure, including mild interstitial pulmonary edema and moderate bilateral pleural effusions. 4. Thickening calcification of the aortic valve, compatible with the reported clinical history of severe aortic stenosis. 5. Cardiomegaly with left ventricular and left atrial dilatation. 6. Cholelithiasis without evidence of acute cholecystitis at this  time. 7. Multiple healing nondisplaced right-sided rib fractures, as above. 8. Additional incidental findings, as above. These results were called by telephone at the time of interpretation on 04/11/2015 at 4:10 pm to Dr. Tonny Bollman, who verbally acknowledged these results. Electronically Signed   By: Trudie Reed M.D.   On: 04/11/2015 16:15      I have independently reviewed the above radiologic studies.  Study Conclusions  - Left ventricle: The cavity size was moderately reduced. Wall thickness was normal. Systolic function was severely reduced. The estimated ejection fraction was in the range of 15% to 20%. Severe diffuse hypokinesis. - Aortic valve: Transvalvular velocity was increased less than expected, due to low cardiac output. There was moderate to severe stenosis. Valve area (VTI): 1.05 cm^2. Valve area (Vmax): 0.96 cm^2. Valve area (Vmean): 1.09 cm^2. - Staged echo: There was evidence of moderate myocardial recruitment with IV dobutamine. All wall segments appear at least partly viable, with the exception of the mid-distal anterior septum, which may be mostly scar.  Impressions:  - There is moderate to severe aortic stenosis (&quot;low gradient aortic stenosis&quot; due to impaired LV function). Improvement in myocardial function is likely after treatment of aortic stenosis.  Dobutamine. Stress echocardiography. 2D. Birthdate: Patient birthdate: 08/05/1928. Age: Patient is 82 yr old. Sex: Gender: male.  BMI: 19.8 kg/m^2. Blood pressure:   134/77 Patient status: Inpatient. Study date: Study date: 04/10/2015. Study time: 10:58 AM.    Recent Lab Findings: Lab Results  Component Value Date   WBC 6.9 04/12/2015   HGB 12.9* 04/12/2015   HCT 39.1 04/12/2015   PLT 219 04/12/2015   GLUCOSE 176* 04/11/2015   CHOL 142 04/08/2015   TRIG 68 04/08/2015   HDL  46 04/08/2015   LDLCALC 82 04/08/2015   NA 141 04/11/2015   K 4.0  04/11/2015   CL 102 04/11/2015   CREATININE 0.91 04/11/2015   BUN 18 04/11/2015   CO2 29 04/11/2015   INR 1.09 04/08/2015      Assessment / Plan:     1Low output low gradient severe AS with decreasing lv function in patient 54 and would require redo surgery for standard avr. I agree with TVAR procedure as the preferred treatment compared to standard redo cardiac surgery and AVR.  2 CT changes in the liver strongly suggestive of cirrhosis, with suspicious hypervascular area in the right lobe of the liver - will need evaluation in future primary team aware of findings    I  spent 40 minutes counseling the patient face to face and 50% or more the  time was spent in counseling and coordination of care. The total time spent in the appointment was 60 minutes.    Delight Ovens MD      [address] [place] [place] [address] Office [phone]   Beeper [phone]  04/12/2015 1:42 PM

## 2015-04-12 NOTE — Progress Notes (Signed)
ANTICOAGULATION CONSULT NOTE - Follow Up Consult  Pharmacy Consult for heparin Indication: chest pain/ACS  Allergies  Allergen Reactions  . Penicillins Rash    Has patient had a PCN reaction causing immediate rash, facial/tongue/throat swelling, SOB or lightheadedness with hypotension: Yes Has patient had a PCN reaction causing severe rash involving mucus membranes or skin necrosis: No Has patient had a PCN reaction that required hospitalization No Has patient had a PCN reaction occurring within the last 10 years: No If all of the above answers are "NO", then may proceed with Cephalosporin use.     Patient Measurements: Height:  (185.4 cm) Weight: 154 lb 6.4 oz (70.035 kg) IBW/kg (Calculated) : 79.9 Heparin Dosing Weight:   Vital Signs: Temp: 98 F (36.7 C) (02/04 0511) Temp Source: Oral (02/04 0511) BP: 139/69 mmHg (02/04 0511) Pulse Rate: 58 (02/04 0511)  Labs:  Recent Labs  04/10/15 0024  04/11/15 0420 04/11/15 1837 04/12/15 0433  HGB 12.4*  --  12.7*  --  12.9*  HCT 37.5*  --  38.9*  --  39.1  PLT 220  --  215  --  219  HEPARINUNFRC  --   < > 0.29* 0.31 0.41  CREATININE  --   --  0.91  --   --   < > = values in this interval not displayed.  Estimated Creatinine Clearance: 59.8 mL/min (by C-G formula based on Cr of 0.91).   Medications:  Scheduled:  . amLODipine  5 mg Oral Daily  . antiseptic oral rinse  7 mL Mouth Rinse BID  . aspirin EC  81 mg Oral Daily  . atorvastatin  40 mg Oral q1800  . DULoxetine  60 mg Oral Daily  . feeding supplement (ENSURE ENLIVE)  237 mL Oral BID BM  . feeding supplement (GLUCERNA SHAKE)  237 mL Oral TID BM  . furosemide  20 mg Oral Daily  . insulin aspart  0-15 Units Subcutaneous TID WC  . isosorbide mononitrate  30 mg Oral Daily  . pantoprazole  40 mg Oral Daily  . sotalol  120 mg Oral Daily   Infusions:  . DOBUTamine 0 mcg/kg/min (04/10/15 1145)  . heparin 1,000 Units/hr (04/11/15 1011)    Assessment: 82 yo  male with chest pain is currently on therapeutic heparin.  HL therapeutic at 0.41, Hgb 12.9, Plts 219, no s/sx bleeding noted.  Goal of Therapy:  Heparin level 0.3-0.7 units/ml Monitor platelets by anticoagulation protocol: Yes   Plan:  - continue heparin at 1000 units/hr - heparin level and CBC in am -Monitor for s/sx of bleeding   Hazle Nordmann, PharmD Pharmacy Resident [phone] 04/12/2015,10:20 AM

## 2015-04-14 ENCOUNTER — Encounter (HOSPITAL_COMMUNITY): Payer: Self-pay | Admitting: *Deleted

## 2015-04-14 ENCOUNTER — Other Ambulatory Visit: Payer: Self-pay | Admitting: *Deleted

## 2015-04-14 MED ORDER — CHLORHEXIDINE GLUCONATE 0.12 % MT SOLN: 15.0000 mL | Freq: Once | OROMUCOSAL | Status: DC

## 2015-04-14 MED ORDER — SODIUM CHLORIDE 0.9 % IV SOLN
INTRAVENOUS | Status: DC
  Filled 2015-04-14: qty 30

## 2015-04-14 MED ORDER — NOREPINEPHRINE BITARTRATE 1 MG/ML IV SOLN
0.0000 ug/min | INTRAVENOUS | Status: AC
  Administered 2015-04-15: 1 ug/min via INTRAVENOUS
  Filled 2015-04-14: qty 4

## 2015-04-14 MED ORDER — DOPAMINE-DEXTROSE 3.2-5 MG/ML-% IV SOLN
0.0000 ug/kg/min | INTRAVENOUS | Status: DC
  Filled 2015-04-14: qty 250

## 2015-04-14 MED ORDER — CHLORHEXIDINE GLUCONATE 4 % EX LIQD: 1.0000 "application " | Freq: Once | CUTANEOUS | Status: DC

## 2015-04-14 MED ORDER — MAGNESIUM SULFATE 50 % IJ SOLN
40.0000 meq | INTRAMUSCULAR | Status: DC
  Filled 2015-04-14: qty 10

## 2015-04-14 MED ORDER — SODIUM CHLORIDE 0.9 % IV SOLN
INTRAVENOUS | Status: AC
  Administered 2015-04-15: 2.5 [IU]/h via INTRAVENOUS
  Filled 2015-04-14: qty 2.5

## 2015-04-14 MED ORDER — NITROGLYCERIN IN D5W 200-5 MCG/ML-% IV SOLN
2.0000 ug/min | INTRAVENOUS | Status: AC
  Administered 2015-04-15: 5 ug/min via INTRAVENOUS
  Filled 2015-04-14: qty 250

## 2015-04-14 MED ORDER — SODIUM CHLORIDE 0.9 % IV SOLN: INTRAVENOUS | Status: DC

## 2015-04-14 MED ORDER — VANCOMYCIN HCL 10 G IV SOLR
1250.0000 mg | INTRAVENOUS | Status: AC
  Administered 2015-04-15: 1250 mg via INTRAVENOUS
  Filled 2015-04-14: qty 1250

## 2015-04-14 MED ORDER — DEXMEDETOMIDINE HCL IN NACL 400 MCG/100ML IV SOLN
0.1000 ug/kg/h | INTRAVENOUS | Status: AC
  Administered 2015-04-15: .5 ug/kg/h via INTRAVENOUS
  Filled 2015-04-14: qty 100

## 2015-04-14 MED ORDER — POTASSIUM CHLORIDE 2 MEQ/ML IV SOLN
80.0000 meq | INTRAVENOUS | Status: DC
  Filled 2015-04-14: qty 40

## 2015-04-14 MED ORDER — EPINEPHRINE HCL 1 MG/ML IJ SOLN
0.0000 ug/min | INTRAMUSCULAR | Status: DC
  Filled 2015-04-14: qty 4

## 2015-04-14 MED ORDER — DEXTROSE 5 % IV SOLN
1.5000 g | INTRAVENOUS | Status: AC
  Administered 2015-04-15: 1.5 g via INTRAVENOUS
  Filled 2015-04-14: qty 1.5

## 2015-04-14 MED ORDER — CHLORHEXIDINE GLUCONATE 4 % EX LIQD: 30.0000 mL | CUTANEOUS | Status: DC

## 2015-04-14 MED ORDER — DEXTROSE 5 % IV SOLN
30.0000 ug/min | INTRAVENOUS | Status: DC
  Filled 2015-04-14: qty 2

## 2015-04-14 NOTE — Progress Notes (Signed)
Spoke with pt's wife for pre-op call, pt in room with her. Instructed her to check pt's blood sugar every 2 hours while awake prior to arrival, if blood sugar is 70 or below to treat with 1/2 cup of Apple juice and then check blood sugar in 15 minutes after drinking the apple juice. If blood sugar is still below 70 call the short stay unit. These instructions given per Diabetes Medication Adjustment Guidelines Prior to Procedure and Surgery. She voiced understanding on all instructions.  She states pt's fasting blood sugar usually runs between 160 -180.

## 2015-04-15 ENCOUNTER — Inpatient Hospital Stay (HOSPITAL_COMMUNITY): Payer: Medicare (Managed Care)

## 2015-04-15 ENCOUNTER — Encounter (HOSPITAL_COMMUNITY): Payer: Self-pay | Admitting: *Deleted

## 2015-04-15 ENCOUNTER — Encounter (HOSPITAL_COMMUNITY)
Admission: RE | Disposition: A | Discharge status: Home / Self Care | Payer: Self-pay | Source: Ambulatory Visit | Attending: Surgery

## 2015-04-15 ENCOUNTER — Inpatient Hospital Stay (HOSPITAL_COMMUNITY)
Admission: RE | Admit: 2015-04-15 | Discharge: 2015-04-18 | DRG: 266 | Disposition: A | Discharge status: Home / Self Care | Payer: Medicare (Managed Care) | Source: Ambulatory Visit | Attending: Surgery | Admitting: Surgery

## 2015-04-15 ENCOUNTER — Inpatient Hospital Stay (HOSPITAL_COMMUNITY): Payer: Medicare (Managed Care) | Admitting: Anesthesiology

## 2015-04-15 ENCOUNTER — Inpatient Hospital Stay (HOSPITAL_COMMUNITY)
Admission: RE | Admit: 2015-04-15 | Discharge: 2015-04-15 | Disposition: A | Discharge status: Home / Self Care | Payer: Medicare (Managed Care) | Source: Ambulatory Visit | Attending: Cardiovascular Disease | Admitting: Cardiovascular Disease

## 2015-04-15 DIAGNOSIS — N400 Enlarged prostate without lower urinary tract symptoms: Secondary | ICD-10-CM | POA: Diagnosis present

## 2015-04-15 DIAGNOSIS — Z9842 Cataract extraction status, left eye: Secondary | ICD-10-CM | POA: Diagnosis not present

## 2015-04-15 DIAGNOSIS — Z951 Presence of aortocoronary bypass graft: Secondary | ICD-10-CM | POA: Diagnosis not present

## 2015-04-15 DIAGNOSIS — Z79899 Other long term (current) drug therapy: Secondary | ICD-10-CM | POA: Diagnosis not present

## 2015-04-15 DIAGNOSIS — E1121 Type 2 diabetes mellitus with diabetic nephropathy: Secondary | ICD-10-CM | POA: Diagnosis present

## 2015-04-15 DIAGNOSIS — Z7982 Long term (current) use of aspirin: Secondary | ICD-10-CM | POA: Diagnosis not present

## 2015-04-15 DIAGNOSIS — Z9841 Cataract extraction status, right eye: Secondary | ICD-10-CM

## 2015-04-15 DIAGNOSIS — Z6821 Body mass index (BMI) 21.0-21.9, adult: Secondary | ICD-10-CM

## 2015-04-15 DIAGNOSIS — I440 Atrioventricular block, first degree: Secondary | ICD-10-CM | POA: Diagnosis present

## 2015-04-15 DIAGNOSIS — Z7984 Long term (current) use of oral hypoglycemic drugs: Secondary | ICD-10-CM

## 2015-04-15 DIAGNOSIS — R079 Chest pain, unspecified: Secondary | ICD-10-CM | POA: Diagnosis present

## 2015-04-15 DIAGNOSIS — D509 Iron deficiency anemia, unspecified: Secondary | ICD-10-CM | POA: Diagnosis present

## 2015-04-15 DIAGNOSIS — Z954 Presence of other heart-valve replacement: Secondary | ICD-10-CM | POA: Diagnosis not present

## 2015-04-15 DIAGNOSIS — Z87891 Personal history of nicotine dependence: Secondary | ICD-10-CM | POA: Diagnosis not present

## 2015-04-15 DIAGNOSIS — I110 Hypertensive heart disease with heart failure: Secondary | ICD-10-CM | POA: Diagnosis present

## 2015-04-15 DIAGNOSIS — I350 Nonrheumatic aortic (valve) stenosis: Secondary | ICD-10-CM | POA: Diagnosis not present

## 2015-04-15 DIAGNOSIS — D696 Thrombocytopenia, unspecified: Secondary | ICD-10-CM | POA: Diagnosis present

## 2015-04-15 DIAGNOSIS — D62 Acute posthemorrhagic anemia: Secondary | ICD-10-CM | POA: Diagnosis not present

## 2015-04-15 DIAGNOSIS — Z880 Allergy status to penicillin: Secondary | ICD-10-CM

## 2015-04-15 DIAGNOSIS — I480 Paroxysmal atrial fibrillation: Secondary | ICD-10-CM | POA: Diagnosis present

## 2015-04-15 DIAGNOSIS — R634 Abnormal weight loss: Secondary | ICD-10-CM | POA: Diagnosis present

## 2015-04-15 DIAGNOSIS — I2510 Atherosclerotic heart disease of native coronary artery without angina pectoris: Secondary | ICD-10-CM | POA: Diagnosis present

## 2015-04-15 DIAGNOSIS — I5023 Acute on chronic systolic (congestive) heart failure: Secondary | ICD-10-CM | POA: Diagnosis not present

## 2015-04-15 DIAGNOSIS — E785 Hyperlipidemia, unspecified: Secondary | ICD-10-CM | POA: Diagnosis present

## 2015-04-15 DIAGNOSIS — K219 Gastro-esophageal reflux disease without esophagitis: Secondary | ICD-10-CM | POA: Diagnosis present

## 2015-04-15 DIAGNOSIS — Z952 Presence of prosthetic heart valve: Secondary | ICD-10-CM

## 2015-04-15 DIAGNOSIS — Z006 Encounter for examination for normal comparison and control in clinical research program: Secondary | ICD-10-CM

## 2015-04-15 DIAGNOSIS — F329 Major depressive disorder, single episode, unspecified: Secondary | ICD-10-CM | POA: Diagnosis present

## 2015-04-15 DIAGNOSIS — I340 Nonrheumatic mitral (valve) insufficiency: Secondary | ICD-10-CM | POA: Diagnosis not present

## 2015-04-15 DIAGNOSIS — E559 Vitamin D deficiency, unspecified: Secondary | ICD-10-CM | POA: Diagnosis present

## 2015-04-15 HISTORY — DX: Personal history of other medical treatment: Z92.89

## 2015-04-15 HISTORY — PX: TEE WITHOUT CARDIOVERSION: SHX5443

## 2015-04-15 HISTORY — DX: Unspecified osteoarthritis, unspecified site: M19.90

## 2015-04-15 HISTORY — PX: TRANSCATHETER AORTIC VALVE REPLACEMENT, TRANSFEMORAL: SHX6400

## 2015-04-15 LAB — POCT I-STAT, CHEM 8
BUN: 13 mg/dL (ref 6–20)
BUN: 13 mg/dL (ref 6–20)
BUN: 13 mg/dL (ref 6–20)
BUN: 14 mg/dL (ref 6–20)
CALCIUM ION: 1.14 mmol/L (ref 1.13–1.30)
CALCIUM ION: 1.15 mmol/L (ref 1.13–1.30)
CALCIUM ION: 1.16 mmol/L (ref 1.13–1.30)
CHLORIDE: 99 mmol/L — AB (ref 101–111)
CHLORIDE: 100 mmol/L — AB (ref 101–111)
CREATININE: 0.7 mg/dL (ref 0.61–1.24)
CREATININE: 0.7 mg/dL (ref 0.61–1.24)
CREATININE: 0.8 mg/dL (ref 0.61–1.24)
Calcium, Ion: 1.16 mmol/L (ref 1.13–1.30)
Chloride: 97 mmol/L — ABNORMAL LOW (ref 101–111)
Chloride: 98 mmol/L — ABNORMAL LOW (ref 101–111)
Creatinine, Ser: 0.7 mg/dL (ref 0.61–1.24)
GLUCOSE: 184 mg/dL — AB (ref 65–99)
GLUCOSE: 186 mg/dL — AB (ref 65–99)
GLUCOSE: 165 mg/dL — AB (ref 65–99)
Glucose, Bld: 91 mg/dL (ref 65–99)
HCT: 33 % — ABNORMAL LOW (ref 39.0–52.0)
HCT: 28 % — ABNORMAL LOW (ref 39.0–52.0)
HEMATOCRIT: 28 % — AB (ref 39.0–52.0)
HEMATOCRIT: 32 % — AB (ref 39.0–52.0)
HEMOGLOBIN: 9.5 g/dL — AB (ref 13.0–17.0)
HEMOGLOBIN: 9.5 g/dL — AB (ref 13.0–17.0)
Hemoglobin: 11.2 g/dL — ABNORMAL LOW (ref 13.0–17.0)
Hemoglobin: 10.9 g/dL — ABNORMAL LOW (ref 13.0–17.0)
POTASSIUM: 3.8 mmol/L (ref 3.5–5.1)
Potassium: 4 mmol/L (ref 3.5–5.1)
Potassium: 3.8 mmol/L (ref 3.5–5.1)
Potassium: 4.3 mmol/L (ref 3.5–5.1)
SODIUM: 136 mmol/L (ref 135–145)
SODIUM: 138 mmol/L (ref 135–145)
Sodium: 136 mmol/L (ref 135–145)
Sodium: 135 mmol/L (ref 135–145)
TCO2: 26 mmol/L (ref 0–100)
TCO2: 25 mmol/L (ref 0–100)
TCO2: 25 mmol/L (ref 0–100)
TCO2: 25 mmol/L (ref 0–100)

## 2015-04-15 LAB — GLUCOSE, CAPILLARY
GLUCOSE-CAPILLARY: 200 mg/dL — AB (ref 65–99)
GLUCOSE-CAPILLARY: 96 mg/dL (ref 65–99)
GLUCOSE-CAPILLARY: 52 mg/dL — AB (ref 65–99)
GLUCOSE-CAPILLARY: 63 mg/dL — AB (ref 65–99)
Glucose-Capillary: 195 mg/dL — ABNORMAL HIGH (ref 65–99)

## 2015-04-15 LAB — CBC
HCT: 31 % — ABNORMAL LOW (ref 39.0–52.0)
HCT: 33.3 % — ABNORMAL LOW (ref 39.0–52.0)
HEMOGLOBIN: 10.6 g/dL — AB (ref 13.0–17.0)
Hemoglobin: 10 g/dL — ABNORMAL LOW (ref 13.0–17.0)
MCH: 29.5 pg (ref 26.0–34.0)
MCH: 29.1 pg (ref 26.0–34.0)
MCHC: 32.3 g/dL (ref 30.0–36.0)
MCHC: 31.8 g/dL (ref 30.0–36.0)
MCV: 91.4 fL (ref 78.0–100.0)
MCV: 91.5 fL (ref 78.0–100.0)
PLATELETS: 170 10*3/uL (ref 150–400)
Platelets: 174 10*3/uL (ref 150–400)
RBC: 3.39 MIL/uL — ABNORMAL LOW (ref 4.22–5.81)
RBC: 3.64 MIL/uL — AB (ref 4.22–5.81)
RDW: 14.8 % (ref 11.5–15.5)
RDW: 14.8 % (ref 11.5–15.5)
WBC: 5.1 10*3/uL (ref 4.0–10.5)
WBC: 7.1 10*3/uL (ref 4.0–10.5)

## 2015-04-15 LAB — ABO/RH: ABO/RH(D): A POS

## 2015-04-15 LAB — POCT I-STAT 4, (NA,K, GLUC, HGB,HCT)
Glucose, Bld: 133 mg/dL — ABNORMAL HIGH (ref 65–99)
HEMATOCRIT: 29 % — AB (ref 39.0–52.0)
HEMOGLOBIN: 9.9 g/dL — AB (ref 13.0–17.0)
Potassium: 3.8 mmol/L (ref 3.5–5.1)
SODIUM: 136 mmol/L (ref 135–145)

## 2015-04-15 LAB — POCT I-STAT 3, ART BLOOD GAS (G3+)
Acid-Base Excess: 1 mmol/L (ref 0.0–2.0)
Bicarbonate: 26.6 mEq/L — ABNORMAL HIGH (ref 20.0–24.0)
O2 Saturation: 92 %
TCO2: 28 mmol/L (ref 0–100)
pCO2 arterial: 45.6 mmHg — ABNORMAL HIGH (ref 35.0–45.0)
pH, Arterial: 7.369 (ref 7.350–7.450)
pO2, Arterial: 63 mmHg — ABNORMAL LOW (ref 80.0–100.0)

## 2015-04-15 LAB — PROTIME-INR
INR: 1.04 (ref 0.00–1.49)
INR: 1.21 (ref 0.00–1.49)
Prothrombin Time: 13.8 seconds (ref 11.6–15.2)
Prothrombin Time: 15.5 seconds — ABNORMAL HIGH (ref 11.6–15.2)

## 2015-04-15 LAB — CREATININE, SERUM
Creatinine, Ser: 0.89 mg/dL (ref 0.61–1.24)
GFR calc non Af Amer: >60 mL/min (ref >60)

## 2015-04-15 LAB — APTT
aPTT: 28 seconds (ref 24–37)
aPTT: 34 seconds (ref 24–37)

## 2015-04-15 LAB — HEPATIC FUNCTION PANEL
ALK PHOS: 140 U/L — AB (ref 38–126)
ALT: 49 U/L (ref 17–63)
AST: 45 U/L — ABNORMAL HIGH (ref 15–41)
Albumin: 3 g/dL — ABNORMAL LOW (ref 3.5–5.0)
BILIRUBIN DIRECT: 0.4 mg/dL (ref 0.1–0.5)
BILIRUBIN INDIRECT: 0.7 mg/dL (ref 0.3–0.9)
Total Bilirubin: 1.1 mg/dL (ref 0.3–1.2)
Total Protein: 5.5 g/dL — ABNORMAL LOW (ref 6.5–8.1)

## 2015-04-15 LAB — PREPARE RBC (CROSSMATCH)

## 2015-04-15 LAB — SURGICAL PCR SCREEN
MRSA, PCR: NEGATIVE
Staphylococcus aureus: NEGATIVE

## 2015-04-15 LAB — MAGNESIUM: MAGNESIUM: 2.9 mg/dL — AB (ref 1.7–2.4)

## 2015-04-15 SURGERY — IMPLANTATION, AORTIC VALVE, TRANSCATHETER, FEMORAL APPROACH
Anesthesia: General | Site: Chest

## 2015-04-15 MED ORDER — PROPOFOL 10 MG/ML IV BOLUS
INTRAVENOUS | Status: AC
  Filled 2015-04-15: qty 20

## 2015-04-15 MED ORDER — OXYCODONE HCL 5 MG PO TABS: 5.0000 mg | ORAL_TABLET | ORAL | Status: DC | PRN

## 2015-04-15 MED ORDER — FENTANYL CITRATE (PF) 250 MCG/5ML IJ SOLN
INTRAMUSCULAR | Status: AC
  Filled 2015-04-15: qty 5

## 2015-04-15 MED ORDER — INSULIN REGULAR BOLUS VIA INFUSION
0.0000 [IU] | Freq: Three times a day (TID) | INTRAVENOUS | Status: DC
  Filled 2015-04-15: qty 10

## 2015-04-15 MED ORDER — CHLORHEXIDINE GLUCONATE 0.12 % MT SOLN
15.0000 mL | OROMUCOSAL | Status: AC
Start: 2015-04-15 — End: 2015-04-15
  Administered 2015-04-15: 15 mL via OROMUCOSAL

## 2015-04-15 MED ORDER — MIDAZOLAM HCL 2 MG/2ML IJ SOLN
INTRAMUSCULAR | Status: AC
  Administered 2015-04-15: 1 mg via INTRAVENOUS
  Filled 2015-04-15: qty 2

## 2015-04-15 MED ORDER — TRAMADOL HCL 50 MG PO TABS: 50.0000 mg | ORAL_TABLET | ORAL | Status: DC | PRN

## 2015-04-15 MED ORDER — SUGAMMADEX SODIUM 200 MG/2ML IV SOLN
INTRAVENOUS | Status: AC
  Filled 2015-04-15: qty 2

## 2015-04-15 MED ORDER — INSULIN ASPART 100 UNIT/ML ~~LOC~~ SOLN: 0.0000 [IU] | SUBCUTANEOUS | Status: DC

## 2015-04-15 MED ORDER — MORPHINE SULFATE (PF) 2 MG/ML IV SOLN
1.0000 mg | INTRAVENOUS | Status: DC | PRN
Start: 2015-04-15 — End: 2015-04-16

## 2015-04-15 MED ORDER — LACTATED RINGERS IV SOLN
INTRAVENOUS | Status: DC
  Administered 2015-04-15: 11:00:00 via INTRAVENOUS

## 2015-04-15 MED ORDER — SODIUM CHLORIDE 0.9 % IV SOLN
INTRAVENOUS | Status: DC
  Filled 2015-04-15: qty 2.5

## 2015-04-15 MED ORDER — SODIUM CHLORIDE 0.9 % IV SOLN
INTRAVENOUS | Status: DC | PRN
  Administered 2015-04-15 (×3): 500 mL

## 2015-04-15 MED ORDER — LIDOCAINE HCL (CARDIAC) 20 MG/ML IV SOLN
INTRAVENOUS | Status: AC
  Filled 2015-04-15: qty 5

## 2015-04-15 MED ORDER — ROCURONIUM BROMIDE 50 MG/5ML IV SOLN
INTRAVENOUS | Status: AC
  Filled 2015-04-15: qty 1

## 2015-04-15 MED ORDER — MUPIROCIN 2 % EX OINT
1.0000 "application " | TOPICAL_OINTMENT | Freq: Once | CUTANEOUS | Status: AC
  Administered 2015-04-15: 1 via TOPICAL

## 2015-04-15 MED ORDER — ACETAMINOPHEN 160 MG/5ML PO SOLN: 1000.0000 mg | Freq: Four times a day (QID) | ORAL | Status: DC

## 2015-04-15 MED ORDER — ACETAMINOPHEN 160 MG/5ML PO SOLN: 650.0000 mg | Freq: Once | ORAL | Status: DC

## 2015-04-15 MED ORDER — PANTOPRAZOLE SODIUM 40 MG PO TBEC: 40.0000 mg | DELAYED_RELEASE_TABLET | Freq: Every day | ORAL | Status: DC

## 2015-04-15 MED ORDER — ETOMIDATE 2 MG/ML IV SOLN
INTRAVENOUS | Status: DC | PRN
  Administered 2015-04-15: 20 mg via INTRAVENOUS

## 2015-04-15 MED ORDER — PROTAMINE SULFATE 10 MG/ML IV SOLN
INTRAVENOUS | Status: DC | PRN
  Administered 2015-04-15 (×3): 20 mg via INTRAVENOUS
  Administered 2015-04-15: 10 mg via INTRAVENOUS
  Administered 2015-04-15: 20 mg via INTRAVENOUS
  Administered 2015-04-15: 10 mg via INTRAVENOUS

## 2015-04-15 MED ORDER — NITROGLYCERIN IN D5W 200-5 MCG/ML-% IV SOLN: 0.0000 ug/min | INTRAVENOUS | Status: DC

## 2015-04-15 MED ORDER — POTASSIUM CHLORIDE 10 MEQ/50ML IV SOLN: 10.0000 meq | INTRAVENOUS | Status: DC

## 2015-04-15 MED ORDER — IODIXANOL 320 MG/ML IV SOLN
INTRAVENOUS | Status: DC | PRN
  Administered 2015-04-15: 80.9 mL via INTRA_ARTERIAL

## 2015-04-15 MED ORDER — POTASSIUM CHLORIDE 10 MEQ/50ML IV SOLN
10.0000 meq | INTRAVENOUS | Status: AC
  Administered 2015-04-15 (×3): 10 meq via INTRAVENOUS

## 2015-04-15 MED ORDER — MIDAZOLAM HCL 2 MG/2ML IJ SOLN
1.0000 mg | INTRAMUSCULAR | Status: DC | PRN
  Administered 2015-04-15: 1 mg via INTRAVENOUS

## 2015-04-15 MED ORDER — DULOXETINE HCL 60 MG PO CPEP
60.0000 mg | ORAL_CAPSULE | Freq: Two times a day (BID) | ORAL | Status: DC
  Administered 2015-04-16 – 2015-04-18 (×5): 60 mg via ORAL
  Filled 2015-04-15 (×6): qty 1

## 2015-04-15 MED ORDER — LACTATED RINGERS IV SOLN: 500.0000 mL | Freq: Once | INTRAVENOUS | Status: DC | PRN

## 2015-04-15 MED ORDER — HEPARIN SODIUM (PORCINE) 1000 UNIT/ML IJ SOLN
INTRAMUSCULAR | Status: DC | PRN
  Administered 2015-04-15: 14000 [IU] via INTRAVENOUS

## 2015-04-15 MED ORDER — 0.9 % SODIUM CHLORIDE (POUR BTL) OPTIME
TOPICAL | Status: DC | PRN
  Administered 2015-04-15: 4000 mL

## 2015-04-15 MED ORDER — LEVOFLOXACIN IN D5W 750 MG/150ML IV SOLN
750.0000 mg | INTRAVENOUS | Status: AC
  Administered 2015-04-16: 750 mg via INTRAVENOUS
  Filled 2015-04-15: qty 150

## 2015-04-15 MED ORDER — DEXMEDETOMIDINE HCL IN NACL 200 MCG/50ML IV SOLN: 0.1000 ug/kg/h | INTRAVENOUS | Status: DC

## 2015-04-15 MED ORDER — GLYCOPYRROLATE 0.2 MG/ML IJ SOLN
INTRAMUSCULAR | Status: AC
  Filled 2015-04-15: qty 1

## 2015-04-15 MED ORDER — PHENYLEPHRINE HCL 10 MG/ML IJ SOLN
0.0000 ug/min | INTRAVENOUS | Status: DC
  Filled 2015-04-15: qty 2

## 2015-04-15 MED ORDER — HEPARIN SODIUM (PORCINE) 1000 UNIT/ML IJ SOLN
INTRAMUSCULAR | Status: AC
  Filled 2015-04-15: qty 1

## 2015-04-15 MED ORDER — EPINEPHRINE HCL 0.1 MG/ML IJ SOSY
PREFILLED_SYRINGE | INTRAMUSCULAR | Status: AC
  Filled 2015-04-15: qty 10

## 2015-04-15 MED ORDER — CLOPIDOGREL BISULFATE 75 MG PO TABS
75.0000 mg | ORAL_TABLET | Freq: Every day | ORAL | Status: DC
  Administered 2015-04-16 – 2015-04-18 (×3): 75 mg via ORAL
  Filled 2015-04-15 (×3): qty 1

## 2015-04-15 MED ORDER — ACETAMINOPHEN 650 MG RE SUPP: 650.0000 mg | Freq: Once | RECTAL | Status: DC

## 2015-04-15 MED ORDER — ONDANSETRON HCL 4 MG/2ML IJ SOLN: 4.0000 mg | Freq: Four times a day (QID) | INTRAMUSCULAR | Status: DC | PRN

## 2015-04-15 MED ORDER — ETOMIDATE 2 MG/ML IV SOLN
INTRAVENOUS | Status: AC
  Filled 2015-04-15: qty 10

## 2015-04-15 MED ORDER — FENTANYL CITRATE (PF) 100 MCG/2ML IJ SOLN
INTRAMUSCULAR | Status: AC
  Administered 2015-04-15: 50 ug via INTRAVENOUS
  Filled 2015-04-15: qty 2

## 2015-04-15 MED ORDER — ASPIRIN 81 MG PO CHEW: 324.0000 mg | CHEWABLE_TABLET | Freq: Every day | ORAL | Status: DC

## 2015-04-15 MED ORDER — VANCOMYCIN HCL IN DEXTROSE 1-5 GM/200ML-% IV SOLN
1000.0000 mg | Freq: Once | INTRAVENOUS | Status: AC
  Administered 2015-04-16: 1000 mg via INTRAVENOUS
  Filled 2015-04-15: qty 200

## 2015-04-15 MED ORDER — ATORVASTATIN CALCIUM 40 MG PO TABS
40.0000 mg | ORAL_TABLET | Freq: Every day | ORAL | Status: DC
  Administered 2015-04-16 – 2015-04-17 (×2): 40 mg via ORAL
  Filled 2015-04-15 (×2): qty 1

## 2015-04-15 MED ORDER — FAMOTIDINE IN NACL 20-0.9 MG/50ML-% IV SOLN
20.0000 mg | Freq: Two times a day (BID) | INTRAVENOUS | Status: DC
  Filled 2015-04-15: qty 50

## 2015-04-15 MED ORDER — MORPHINE SULFATE (PF) 2 MG/ML IV SOLN: 2.0000 mg | INTRAVENOUS | Status: DC | PRN

## 2015-04-15 MED ORDER — FENTANYL CITRATE (PF) 100 MCG/2ML IJ SOLN
50.0000 ug | INTRAMUSCULAR | Status: DC | PRN
  Administered 2015-04-15: 50 ug via INTRAVENOUS

## 2015-04-15 MED ORDER — METOCLOPRAMIDE HCL 5 MG/ML IJ SOLN
10.0000 mg | Freq: Four times a day (QID) | INTRAMUSCULAR | Status: DC
  Administered 2015-04-16 (×2): 10 mg via INTRAVENOUS
  Filled 2015-04-15 (×2): qty 2

## 2015-04-15 MED ORDER — SUGAMMADEX SODIUM 200 MG/2ML IV SOLN
INTRAVENOUS | Status: DC | PRN
  Administered 2015-04-15: 140 mg via INTRAVENOUS

## 2015-04-15 MED ORDER — ONDANSETRON HCL 4 MG/2ML IJ SOLN
INTRAMUSCULAR | Status: AC
  Filled 2015-04-15: qty 2

## 2015-04-15 MED ORDER — MUPIROCIN 2 % EX OINT
TOPICAL_OINTMENT | CUTANEOUS | Status: AC
  Administered 2015-04-15: 1 via TOPICAL
  Filled 2015-04-15: qty 22

## 2015-04-15 MED ORDER — FENTANYL CITRATE (PF) 100 MCG/2ML IJ SOLN
INTRAMUSCULAR | Status: DC | PRN
  Administered 2015-04-15: 50 ug via INTRAVENOUS
  Administered 2015-04-15 (×2): 100 ug via INTRAVENOUS

## 2015-04-15 MED ORDER — ROCURONIUM BROMIDE 100 MG/10ML IV SOLN
INTRAVENOUS | Status: DC | PRN
  Administered 2015-04-15: 50 mg via INTRAVENOUS
  Administered 2015-04-15 (×2): 10 mg via INTRAVENOUS

## 2015-04-15 MED ORDER — CETYLPYRIDINIUM CHLORIDE 0.05 % MT LIQD
7.0000 mL | Freq: Two times a day (BID) | OROMUCOSAL | Status: DC
  Administered 2015-04-15: 7 mL via OROMUCOSAL

## 2015-04-15 MED ORDER — MAGNESIUM SULFATE 4 GM/100ML IV SOLN
4.0000 g | Freq: Once | INTRAVENOUS | Status: AC
Start: 2015-04-15 — End: 2015-04-15
  Administered 2015-04-15: 4 g via INTRAVENOUS
  Filled 2015-04-15: qty 100

## 2015-04-15 MED ORDER — ASPIRIN EC 325 MG PO TBEC: 325.0000 mg | DELAYED_RELEASE_TABLET | Freq: Every day | ORAL | Status: DC

## 2015-04-15 MED ORDER — GLYCOPYRROLATE 0.2 MG/ML IJ SOLN
INTRAMUSCULAR | Status: DC | PRN
  Administered 2015-04-15 (×2): 0.2 mg via INTRAVENOUS

## 2015-04-15 MED ORDER — ACETAMINOPHEN 500 MG PO TABS: 1000.0000 mg | ORAL_TABLET | Freq: Four times a day (QID) | ORAL | Status: DC

## 2015-04-15 MED ORDER — SODIUM CHLORIDE 0.9 % IV SOLN
1.0000 mL/kg/h | INTRAVENOUS | Status: DC
  Administered 2015-04-15: 1 mL/kg/h via INTRAVENOUS

## 2015-04-15 MED ORDER — ALBUMIN HUMAN 5 % IV SOLN: 250.0000 mL | INTRAVENOUS | Status: DC | PRN

## 2015-04-15 MED ORDER — PROTAMINE SULFATE 10 MG/ML IV SOLN
INTRAVENOUS | Status: AC
  Filled 2015-04-15: qty 25

## 2015-04-15 SURGICAL SUPPLY — 101 items
ADAPTER UNIV SWAN GANZ BIP (ADAPTER) ×2 IMPLANT
ADAPTER UNV SWAN GANZ BIP (ADAPTER) ×2
ATTRACTOMAT 16X20 MAGNETIC DRP (DRAPES) IMPLANT
BAG BANDED W/RUBBER/TAPE 36X54 (MISCELLANEOUS) ×8 IMPLANT
BAG DECANTER FOR FLEXI CONT (MISCELLANEOUS) IMPLANT
BAG SNAP BAND KOVER 36X36 (MISCELLANEOUS) ×8 IMPLANT
BLADE 10 SAFETY STRL DISP (BLADE) ×4 IMPLANT
BLADE STERNUM SYSTEM 6 (BLADE) ×4 IMPLANT
BLADE SURG ROTATE 9660 (MISCELLANEOUS) IMPLANT
CABLE PACING FASLOC BIEGE (MISCELLANEOUS) IMPLANT
CABLE PACING FASLOC BLUE (MISCELLANEOUS) ×4 IMPLANT
CANISTER SUCTION 2500CC (MISCELLANEOUS) IMPLANT
CANNULA FEM VENOUS REMOTE 22FR (CANNULA) IMPLANT
CANNULA OPTISITE PERFUSION 16F (CANNULA) IMPLANT
CANNULA OPTISITE PERFUSION 18F (CANNULA) IMPLANT
CATH DIAG EXPO 6F AL2 (CATHETERS) ×4 IMPLANT
CATH DIAG EXPO 6F VENT PIG 145 (CATHETERS) ×8 IMPLANT
CATH S G BIP PACING (SET/KITS/TRAYS/PACK) ×8 IMPLANT
CLIP TI MEDIUM 24 (CLIP) ×4 IMPLANT
CLIP TI WIDE RED SMALL 24 (CLIP) ×8 IMPLANT
CONT SPEC 4OZ CLIKSEAL STRL BL (MISCELLANEOUS) ×12 IMPLANT
COVER DOME SNAP 22 D (MISCELLANEOUS) ×4 IMPLANT
COVER MAYO STAND STRL (DRAPES) ×8 IMPLANT
COVER TABLE BACK 60X90 (DRAPES) ×4 IMPLANT
CRADLE DONUT ADULT HEAD (MISCELLANEOUS) ×4 IMPLANT
DERMABOND ADVANCED (GAUZE/BANDAGES/DRESSINGS) ×2
DERMABOND ADVANCED .7 DNX12 (GAUZE/BANDAGES/DRESSINGS) ×2 IMPLANT
DRAPE INCISE IOBAN 66X45 STRL (DRAPES) IMPLANT
DRAPE SLUSH MACHINE 52X66 (DRAPES) ×4 IMPLANT
DRAPE TABLE COVER HEAVY DUTY (DRAPES) ×4 IMPLANT
DRSG TEGADERM 2-3/8X2-3/4 SM (GAUZE/BANDAGES/DRESSINGS) ×4 IMPLANT
DRSG TEGADERM 4X4.75 (GAUZE/BANDAGES/DRESSINGS) ×4 IMPLANT
ELECT REM PT RETURN 9FT ADLT (ELECTROSURGICAL) ×8
ELECTRODE REM PT RTRN 9FT ADLT (ELECTROSURGICAL) ×4 IMPLANT
FELT TEFLON 6X6 (MISCELLANEOUS) ×4 IMPLANT
FEMORAL VENOUS CANN RAP (CANNULA) IMPLANT
GAUZE SPONGE 2X2 8PLY STRL LF (GAUZE/BANDAGES/DRESSINGS) ×2 IMPLANT
GAUZE SPONGE 4X4 12PLY STRL (GAUZE/BANDAGES/DRESSINGS) ×4 IMPLANT
GLOVE BIO SURGEON STRL SZ7.5 (GLOVE) ×4 IMPLANT
GLOVE BIO SURGEON STRL SZ8 (GLOVE) ×8 IMPLANT
GLOVE BIOGEL PI IND STRL 6.5 (GLOVE) ×10 IMPLANT
GLOVE BIOGEL PI INDICATOR 6.5 (GLOVE) ×10
GLOVE EUDERMIC 7 POWDERFREE (GLOVE) ×4 IMPLANT
GLOVE ORTHO TXT STRL SZ7.5 (GLOVE) ×8 IMPLANT
GOWN STRL REUS W/ TWL LRG LVL3 (GOWN DISPOSABLE) ×6 IMPLANT
GOWN STRL REUS W/ TWL XL LVL3 (GOWN DISPOSABLE) ×12 IMPLANT
GOWN STRL REUS W/TWL LRG LVL3 (GOWN DISPOSABLE) ×6
GOWN STRL REUS W/TWL XL LVL3 (GOWN DISPOSABLE) ×12
GUIDEWIRE SAF TJ AMPL .035X180 (WIRE) ×4 IMPLANT
GUIDEWIRE SAFE TJ AMPLATZ EXST (WIRE) ×4 IMPLANT
INSERT FOGARTY 61MM (MISCELLANEOUS) ×4 IMPLANT
INSERT FOGARTY SM (MISCELLANEOUS) ×8 IMPLANT
INSERT FOGARTY XLG (MISCELLANEOUS) IMPLANT
KIT BASIN OR (CUSTOM PROCEDURE TRAY) ×4 IMPLANT
KIT DILATOR VASC 18G NDL (KITS) IMPLANT
KIT HEART LEFT (KITS) ×4 IMPLANT
KIT ROOM TURNOVER OR (KITS) ×4 IMPLANT
KIT SUCTION CATH 14FR (SUCTIONS) ×8 IMPLANT
LIQUID BAND (GAUZE/BANDAGES/DRESSINGS) ×4 IMPLANT
NEEDLE PERC 18GX7CM (NEEDLE) ×4 IMPLANT
NS IRRIG 1000ML POUR BTL (IV SOLUTION) ×12 IMPLANT
PACK AORTA (CUSTOM PROCEDURE TRAY) ×4 IMPLANT
PAD ARMBOARD 7.5X6 YLW CONV (MISCELLANEOUS) ×8 IMPLANT
PAD ELECT DEFIB RADIOL ZOLL (MISCELLANEOUS) ×4 IMPLANT
PATCH TACHOSII LRG 9.5X4.8 (VASCULAR PRODUCTS) IMPLANT
SET MICROPUNCTURE 5F STIFF (MISCELLANEOUS) ×8 IMPLANT
SHEATH AVANTI 11CM 8FR (MISCELLANEOUS) ×4 IMPLANT
SHEATH PINNACLE 6F 10CM (SHEATH) ×8 IMPLANT
SPONGE GAUZE 2X2 STER 10/PKG (GAUZE/BANDAGES/DRESSINGS) ×2
SPONGE LAP 4X18 X RAY DECT (DISPOSABLE) ×4 IMPLANT
STOPCOCK 4 WAY LG BORE MALE ST (IV SETS) ×4 IMPLANT
STOPCOCK MORSE 400PSI 3WAY (MISCELLANEOUS) ×12 IMPLANT
SUT ETHIBOND X763 2 0 SH 1 (SUTURE) ×4 IMPLANT
SUT GORETEX CV 4 TH 22 36 (SUTURE) ×4 IMPLANT
SUT GORETEX CV4 TH-18 (SUTURE) ×12 IMPLANT
SUT GORETEX TH-18 36 INCH (SUTURE) ×8 IMPLANT
SUT MNCRL AB 3-0 PS2 18 (SUTURE) ×4 IMPLANT
SUT PROLENE 3 0 SH1 36 (SUTURE) IMPLANT
SUT PROLENE 4 0 RB 1 (SUTURE) ×2
SUT PROLENE 4-0 RB1 .5 CRCL 36 (SUTURE) ×2 IMPLANT
SUT PROLENE 5 0 C 1 36 (SUTURE) ×8 IMPLANT
SUT PROLENE 6 0 C 1 30 (SUTURE) ×8 IMPLANT
SUT SILK  1 MH (SUTURE) ×2
SUT SILK 1 MH (SUTURE) ×2 IMPLANT
SUT SILK 2 0 SH CR/8 (SUTURE) IMPLANT
SUT VIC AB 2-0 CT1 27 (SUTURE) ×2
SUT VIC AB 2-0 CT1 TAPERPNT 27 (SUTURE) ×2 IMPLANT
SUT VIC AB 2-0 CTX 36 (SUTURE) IMPLANT
SUT VIC AB 3-0 SH 8-18 (SUTURE) ×8 IMPLANT
SYR 30ML LL (SYRINGE) ×8 IMPLANT
SYR 50ML LL SCALE MARK (SYRINGE) ×4 IMPLANT
SYR MEDRAD MARK V 150ML (SYRINGE) ×4 IMPLANT
SYRINGE 10CC LL (SYRINGE) ×8 IMPLANT
TOWEL OR 17X26 10 PK STRL BLUE (TOWEL DISPOSABLE) ×12 IMPLANT
TRANSDUCER W/STOPCOCK (MISCELLANEOUS) ×4 IMPLANT
TRAY FOLEY IC TEMP SENS 14FR (CATHETERS) ×4 IMPLANT
TUBE SUCT INTRACARD DLP 20F (MISCELLANEOUS) IMPLANT
TUBING HIGH PRESSURE 120CM (CONNECTOR) ×4 IMPLANT
VALVE HEART TRANSCATH SZ3 26MM (Prosthesis & Implant Heart) ×4 IMPLANT
WIRE BENTSON .035X145CM (WIRE) ×4 IMPLANT
WIRE J 3MM .035X145CM (WIRE) ×4 IMPLANT

## 2015-04-15 NOTE — Transfer of Care (Signed)
Immediate Anesthesia Transfer of Care Note  Patient: Gary Beck  Procedure(s) Performed: Procedure(s): TRANSCATHETER AORTIC VALVE REPLACEMENT, TRANSFEMORAL (N/A) TRANSESOPHAGEAL ECHOCARDIOGRAM (TEE) (N/A)  Patient Location: ICU  Anesthesia Type:General  Level of Consciousness: awake  Airway & Oxygen Therapy: Patient Spontanous Breathing and Patient connected to face mask oxygen  Post-op Assessment: Report given to RN  Post vital signs: Reviewed and stable  Last Vitals:  Filed Vitals:   04/15/15 1056 04/15/15 1347  BP: 150/70   Pulse: 55 57  Temp: 36.3 C   Resp: 18 13    Complications: No apparent anesthesia complications

## 2015-04-15 NOTE — Brief Op Note (Signed)
04/15/2015  5:00 PM  PATIENT:  Gary Beck  82 y.o. male  PRE-OPERATIVE DIAGNOSIS:  SEVERE AORTIC STENOSIS   POST-OPERATIVE DIAGNOSIS:  SEVERE AORTIC STENOSIS  PROCEDURE:  Procedure(s): TRANSCATHETER AORTIC VALVE REPLACEMENT, TRANSFEMORAL (N/A) TRANSESOPHAGEAL ECHOCARDIOGRAM (TEE) (N/A)  SURGEON:  Surgeon(s) and Role:    * Tonny Bollman, MD - Primary    * Alleen Borne, MD - Co-Surgeon  PHYSICIAN ASSISTANT: none  ASSISTANTS: none   ANESTHESIA:   general  EBL:  Total I/O In: 500 [I.V.:500] Out: -   BLOOD ADMINISTERED:none  DRAINS: none   LOCAL MEDICATIONS USED:  NONE  SPECIMEN:  No Specimen  DISPOSITION OF SPECIMEN:  N/A  COUNTS:  YES  TOURNIQUET:  * No tourniquets in log *  DICTATION: .Note written in EPIC  PLAN OF CARE: Admit to inpatient   PATIENT DISPOSITION:  ICU - extubated and stable.   Delay start of Pharmacological VTE agent (>24hrs) due to surgical blood loss or risk of bleeding: yes

## 2015-04-15 NOTE — H&P (Signed)
[address]       [place] [address]             [phone]      Cardiothoracic Surgery Admission History and Physical   Referring Provider is [name], MD PCP is [facility], MD  Reason for Consultation: Aortic stenosis with severe left ventricular dysfunction  HPI:  The patient is an 82 year old gentleman from [place], [place] with a history of hypertension, hyperlipidemia, type 2 DM, PAF on Eliquis, coronary artery disease, s/p CABG x 3 in 2002 and known moderate to severe aortic stenosis. The patient's primary cardiologist is Dr. [name] in [place]. I have interviewed the patient and his son and daughter and have reviewed the records available in the paper chart from [facility]. He reports his problems starting in May 2016 when he was diagnosed with new onset atrial fibrillation. About that time he began having some exertional fatigue, shortness of breath and episodes of dizziness. 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 He was treated with antiarrhythmic drugs and Eliquis and eventually underwent DCCV. He underwent an echo sometime last summer that reportedly showed moderate to severe AS with a mean AV gradient of 28 mm Hg and a peak of 52 mm Hg with an AVA of 1 cm2. The EF at that time was 45-50%. From the cardiology notes it was felt that his symptoms were likely due to the aortic stenosis but he was not felt to be a candidate for TAVR since his mean gradient was only 28 mm Hg. His symptoms of dizziness and exertional fatigue/SOB continued to progress and he also noted some episodes of intermittent chest pain. Reading through the notes from [facility] it sounds like another echo was done around November 2016 which again showed moderate to severe AS with a mean AV gradient of 21 mm Hg and a peak of 44 mm Hg with an AVA of 0.93 cm2. His symptoms were still felt to be due to his AS but he was still not felt to be a  TAVR candidate because his peak velocity was only 3.3 m/sec. A cath was done in December to see if he had recurrent coronary or graft stenoses for consideration of open surgical AVR and CABG. The cath report shows a mean AV gradient of 19 mm Hg with a calculated AVA of 1.2 cm2. LVEDP was 18. CI was 2.3 by thermodilution and 2.8 by Fick. Coronary angiography showed severe diffuse coronary disease with patent LIMA to the LAD, patent SVG's to the LCX and Diagonal. The LV showed normal wall motion with an EF of 50%.  He recently presented to [facility] with chest pain and was noted to have a mildly elevated troponin of 0.06. An attempt was made to transfer him to [facility] in [place] but there were no beds and he eventually was transferred to [facility] for further evaluation. His Troponin has remained 0.06 and ECG showed sinus rhythm with non-specified intraventricular block. An echo today showed an LVEF of 15-20% with diffuse hypokinesis, grade 3 diastolic dysfunction. The aortic valve is trileaflet and severely thickened and calcified with severely reduced cusp separation consistent with severe AS. The mean gradient was 14 mm Hg with a DI of 0.26. There is moderate MR with a severely dilated LA. The RV appeared normal.  His son reports progressive exertional fatigue and shortness of breath. He says that  his father frequently gets dizzy and looks like he is going to pass out when he stands up. The patient says that he does ok walking around in the house but has been sedentary recently. He can't do any strenuous activity. He has poor appetite and has lost 25 lbs over the past year. He has had bilateral ankle edema.   He was seen in the hospital by Dr. Excell Seltzer and underwent a dobutamine stress echo showing a baseline EF of 15-20% with a mean AV gradient of 22 mm Hg that increased to 31 mm Hg with dobutamine 10 mcg and EF improved to 30% suggesting that he does have low gradient, low EF severe AS.    Past Medical History  Diagnosis Date  . CAD (coronary artery disease)     Multivessel status post CABG 2002  . Essential hypertension   . Depression   . Type 2 diabetes mellitus (HCC)   . PAF (paroxysmal atrial fibrillation) (HCC)     On Sotalol (previously Eliquis - stopped 02/2015)  . Aortic stenosis     Moderate to severe May 2016  . Vitamin D deficiency   . Diabetic nephropathy (HCC)   . BPH (benign prostatic hyperplasia)   . GERD (gastroesophageal reflux disease)   . Chronic systolic heart failure (HCC)     LVEF 45-50% May 2016  . Medication intolerance     Amiodarone - nausea and emesis  . History of fall     Right wrist fracture and facial contusion 02/2015, possibly associated with orthostasis  . Iron deficiency anemia     Past Surgical History  Procedure Laterality Date  . Cataract surgery Bilateral   . Coronary artery bypass graft  2002     [facility]  . Adenoidectomy, tonsillectomy and myringotomy with tube placement      Family History  Problem Relation Age of Onset  . Heart attack Father   . Hypertension Father   . Diabetes Mellitus II Brother     Social History   Social History  . Marital Status: Married    Spouse Name: N/A  . Number of Children: 1  . Years of Education: N/A   Occupational History  . Not on file.   Social History Main Topics  . Smoking status: Former Smoker    Types: Cigarettes    Quit date: 03/09/1959  . Smokeless tobacco: Not on file  . Alcohol Use: No  . Drug Use: No  . Sexual Activity: Not on file   Other Topics Concern  . Not on file   Social History Narrative    Current Facility-Administered Medications  Medication Dose Route Frequency Provider Last Rate Last Dose  . acetaminophen (TYLENOL) tablet 650 mg 650 mg Oral  Q4H PRN Ellsworth Lennox, PA    . amLODipine (NORVASC) tablet 5 mg 5 mg Oral Daily Bhavinkumar Bhagat, PA  5 mg at 04/09/15 0854  . antiseptic oral rinse (CPC / CETYLPYRIDINIUM CHLORIDE 0.05%) solution 7 mL 7 mL Mouth Rinse BID Lennette Bihari, MD  7 mL at 04/09/15 1000  . aspirin EC tablet 81 mg 81 mg Oral Daily Ellsworth Lennox, [place]  81 mg at 04/09/15 0854  . atorvastatin (LIPITOR) tablet 40 mg 40 mg Oral q1800 Ellsworth Lennox, [place]  40 mg at 04/09/15 1737  . DULoxetine (CYMBALTA) DR capsule 60 mg 60 mg Oral Daily Ellsworth Lennox, [place]  60 mg at 04/09/15 0854  . feeding supplement (ENSURE ENLIVE) (ENSURE ENLIVE)  liquid 237 mL 237 mL Oral BID BM Lennette Bihari, MD  237 mL at 04/09/15 1000  . feeding supplement (GLUCERNA SHAKE) (GLUCERNA SHAKE) liquid 237 mL 237 mL Oral TID BM Idell Pickles, RD  237 mL at 04/09/15 2000  . furosemide (LASIX) tablet 20 mg 20 mg Oral Daily Ellsworth Lennox, [place]  20 mg at 04/09/15 0854  . heparin ADULT infusion 100 units/mL (25000 units/250 mL) 800 Units/hr Intravenous Continuous Signe Colt, RPH 8 mL/hr at 04/09/15 1800 800 Units/hr at 04/09/15 1800  . hydrALAZINE (APRESOLINE) injection 5 mg 5 mg Intravenous Q8H PRN Ellsworth Lennox, [place]  5 mg at 04/07/15 2107  . insulin aspart (novoLOG) injection 0-15 Units 0-15 Units Subcutaneous TID [facility] Ellsworth Lennox, [place]  5 Units at 04/09/15 1130  . isosorbide mononitrate (IMDUR) 24 hr tablet 30 mg 30 mg Oral Daily Bhavinkumar Bhagat, PA  30 mg at 04/09/15 0854  . nitroGLYCERIN (NITROSTAT) SL tablet 0.4 mg 0.4 mg Sublingual Q5 Min x 3 PRN Ellsworth Lennox, PA  0.4 mg at 04/08/15 0036  . ondansetron (ZOFRAN) injection 4 mg 4 mg Intravenous Q6H PRN Ellsworth Lennox, PA    . pantoprazole (PROTONIX) EC tablet 40 mg 40 mg Oral Daily Ellsworth Lennox, [place]  40 mg at  04/09/15 0853  . sotalol (BETAPACE) tablet 120 mg 120 mg Oral Daily Ellsworth Lennox, [place]  120 mg at 04/09/15 1125    Allergies  Allergen Reactions  . Penicillins Rash    Has patient had a PCN reaction causing immediate rash, facial/tongue/throat swelling, SOB or lightheadedness with hypotension: Yes Has patient had a PCN reaction causing severe rash involving mucus membranes or skin necrosis: No Has patient had a PCN reaction that required hospitalization No Has patient had a PCN reaction occurring within the last 10 years: No If all of the above answers are "NO", then may proceed with Cephalosporin use.       Review of Systems:  General:decreased appetite, poor energy, no weight gain, 25 lb weight loss, no fever Cardiac:intermittent chest pain with exertion, o chest pain at rest, moderate SOB with moderate exertion, no resting SOB, no PND, no orthopnea, has palpitations, arrhythmia, atrial fibrillation, bilateral LE edema, frequent dizzy spells, near syncope Respiratory:has shortness of breath, no home oxygen, no productive cough, no dry cough, no bronchitis, no wheezing, no hemoptysis, no asthma, no pain with inspiration or cough, no sleep apnea, no CPAP at night GI:no difficulty swallowing, no reflux, no frequent heartburn, no hiatal hernia, no abdominal pain, no constipation, no diarrhea, no hematochezia, no hematemesis, no melena GU:no dysuria, no frequency, no urinary tract infection, no hematuria, has enlarged prostate, no kidney stones, no kidney disease Vascular:no pain suggestive of claudication, no pain in feet, no leg cramps, no varicose veins, no DVT, no non-healing foot ulcer Neuro:no stroke, no TIA's,  no seizures, no headaches, notemporary blindness one eye, no slurred speech, no peripheral neuropathy, no chronic pain, no instability of gait, no memory/cognitive dysfunction Musculoskeletal:no arthritis, no joint swelling, no myalgias, no difficulty walking, no mobility  Skin:no rash, no itching, no skin infections, no pressure sores or ulcerations Psych:no anxiety, no depression, no nervousness, no unusual recent stress Eyes:no blurry vision, no floaters, no recent vision changes, wears glasses or contacts ENT:no hearing loss, no loose or painful teeth, wears dentures,  Hematologic:no easy bruising, no abnormal bleeding, no clotting disorder, no frequent epistaxis Endocrine:has diabetes, does check CBG's at home  Physical Exam:  BP 138/76 mmHg  Pulse 59  Temp(Src) 98.1 F (36.7 C) (Oral)  Resp 14  Ht  (1.854 m)  Wt 67.495 kg (148 lb 12.8 oz)  BMI 19.64 kg/m2  SpO2 94% General:Elderly, frail-appearing HEENT:Unremarkable , NCAT, PERLA, EOMI Neck:no JVD, no bruits, no adenopathy or thyromegaly Chest:clear to auscultation, symmetrical breath sounds, no wheezes, no rhonchi  CV:RRR, grade II/VI crescendo/decrescendo murmur heard best at LSB, no diastolic murmur Abdomen:soft, non-tender, no masses or organomegaly Extremities:warm, well-perfused, pulses palpable in feet, mild LE  edema Rectal/GUDeferred Neuro:Grossly non-focal and symmetrical throughout Skin:Clean and dry, no rashes, no breakdown   Diagnostic Tests:          *[place]*         *[facility]*            [address]            [place], [place] [address]              [phone]  ------------------------------------------------------------------- Transthoracic Echocardiography  Patient:  Ridge, Lafond MR #:    [id number] Study Date: 04/09/2015 Gender:   M Age:    86 Height:   185.4 cm Weight:   67.1 kg BSA:    1.85 m^2 Pt. Status: Room:    3W15C  ADMITTING  [name], M.D. ATTENDING  [name], M.D. PERFORMING  [facility], Inpatient SONOGRAPHER Thurman Coyer Teressa Lower, [place] M REFERRING  [place] Ouch [place] M  cc:  ------------------------------------------------------------------- LV EF: 15% -  20%  ------------------------------------------------------------------- Indications:   Chest pain 786.51.  ------------------------------------------------------------------- History:  PMH:  Atrial fibrillation. Coronary artery disease. Aortic valve disease. Risk factors: Former tobacco use. Hypertension. Diabetes mellitus.  ------------------------------------------------------------------- Study Conclusions  - Left ventricle: The cavity size was moderately dilated. Systolic function was severely reduced. The estimated ejection fraction was in the range of 15% to 20%. Diffuse hypokinesis. Doppler parameters are consistent with a reversible restrictive pattern, indicative of decreased left ventricular diastolic compliance and/or increased left atrial pressure (grade 3 diastolic dysfunction). - Aortic valve: Trileaflet;  severely thickened, severely calcified leaflets. Cusp separation was severely reduced. Findings suggestive of severe stenosis (low gradient, low output). There was mild regurgitation. Peak velocity (S): 270 cm/s. Mean gradient (S): 14 mm Hg. Valve area (VTI): 0.99 cm^2. Valve area (Vmax): 1 cm^2. Valve area (Vmean): 0.87 cm^2. - Mitral valve: Calcified annulus. There was moderate regurgitation. - Left atrium: The atrium was severely dilated. - Right atrium: The atrium was mildly dilated.  Transthoracic echocardiography. M-mode, complete 2D, spectral Doppler, and color Doppler. Birthdate: Patient birthdate: [birth year]. Age: Patient is 82 yr old. Sex: Gender: male. BMI: 19.5 kg/m^2. Blood pressure:   140/65 Patient status: Inpatient. Study date: Study date: 04/09/2015. Study time: 10:42 AM. Location: Echo laboratory.  -------------------------------------------------------------------  ------------------------------------------------------------------- Left ventricle: The cavity size was moderately dilated. Systolic function was severely reduced. The estimated ejection fraction was in the range of 15% to 20%. Diffuse hypokinesis. Doppler parameters are consistent with a reversible restrictive pattern, indicative of decreased left ventricular diastolic compliance and/or increased left atrial pressure (grade 3 diastolic dysfunction).  ------------------------------------------------------------------- Aortic valve:  Trileaflet; severely thickened, severely calcified leaflets. Cusp separation was severely reduced. Doppler: Findings suggestive of severe stenosis (low gradient, low output). There was mild regurgitation.  VTI ratio of LVOT to aortic valve: 0.26. Valve area (VTI): 0.99 cm^2. Indexed valve area (VTI): 0.54 cm^2/m^2. Peak velocity ratio of LVOT to aortic valve: 0.26. Valve area (Vmax): 1 cm^2.  Indexed valve area (Vmax): 0.54 cm^2/m^2.  Mean velocity ratio of LVOT to aortic valve: 0.23. Valve area (Vmean): 0.87 cm^2. Indexed valve area (Vmean): 0.47 cm^2/m^2.  Mean gradient (S): 14 mm Hg. Peak gradient (S): 29 mm Hg.  ------------------------------------------------------------------- Aorta: Aortic root: The aortic root was normal in size.  ------------------------------------------------------------------- Mitral valve:  Calcified annulus. Mobility was not restricted. Doppler: Transvalvular velocity was within the normal range. There was no evidence for stenosis. There was moderate regurgitation. Peak gradient (D): 4 mm Hg.  ------------------------------------------------------------------- Left atrium: The atrium was severely dilated.  ------------------------------------------------------------------- Right ventricle: The cavity size was normal. Wall thickness was normal. Systolic function was normal.  ------------------------------------------------------------------- Pulmonic valve:  Poorly visualized. Structurally normal valve. Cusp separation was normal. Doppler: Transvalvular velocity was within the normal range. There was no evidence for stenosis. There was no regurgitation.  ------------------------------------------------------------------- Tricuspid valve:  Structurally normal valve.  Doppler: Transvalvular velocity was within the normal range. There was no regurgitation.  ------------------------------------------------------------------- Pulmonary artery:  The main pulmonary artery was normal-sized. Systolic pressure was within the normal range.  ------------------------------------------------------------------- Right atrium: The atrium was mildly dilated.  ------------------------------------------------------------------- Pericardium: There was no pericardial effusion.  ------------------------------------------------------------------- Systemic veins: Inferior vena  cava: The vessel was normal in size.  ------------------------------------------------------------------- Measurements  Left ventricle              Value     Reference LV ID, ED, PLAX chordal      (H)   62  mm    43 - 52 LV ID, ES, PLAX chordal      (H)   46.4 mm    23 - 38 LV fx shortening, PLAX chordal  (L)   25  %    >=29 LV PW thickness, ED            11.3 mm    --------- IVS/LV PW ratio, ED            0.7      <=1.3 Stroke volume, 2D             62  ml    --------- Stroke volume/bsa, 2D           34  ml/m^2  --------- LV ejection fraction, 1-p A4C       28  %    --------- LV end-diastolic volume, 2-p       152  ml    --------- LV end-systolic volume, 2-p        104  ml    --------- LV ejection fraction, 2-p         32  %    --------- Stroke volume, 2-p            48  ml    --------- LV end-diastolic volume/bsa, 2-p     82  ml/m^2  --------- LV end-systolic volume/bsa, 2-p      56  ml/m^2  --------- Stroke volume/bsa, 2-p          26  ml/m^2  --------- LV e&', lateral              6.08 cm/s   --------- LV E/e&', lateral             16.45     --------- LV e&', medial               3.75 cm/s   --------- LV E/e&', medial              26.67     ---------  LV e&', average              4.92 cm/s   --------- LV E/e&', average             20.35     ---------  Ventricular septum            Value     Reference IVS thickness, ED             7.96 mm    ---------  LVOT                   Value     Reference LVOT ID, S                22  mm    --------- LVOT area                  3.8  cm^2   --------- LVOT peak velocity, S           70.9 cm/s   --------- LVOT mean velocity, S           37.6 cm/s   --------- LVOT VTI, S                16.4 cm    --------- LVOT peak gradient, S           2   mm Hg  ---------  Aortic valve               Value     Reference Aortic valve peak velocity, S       270  cm/s   --------- Aortic valve mean velocity, S       165  cm/s   --------- Aortic valve VTI, S            62.8 cm    --------- Aortic mean gradient, S          14  mm Hg  --------- Aortic peak gradient, S          29  mm Hg  --------- VTI ratio, LVOT/AV            0.26      --------- Aortic valve area, VTI          0.99 cm^2   --------- Aortic valve area/bsa, VTI        0.54 cm^2/m^2 --------- Velocity ratio, peak, LVOT/AV       0.26      --------- Aortic valve area, peak velocity     1   cm^2   --------- Aortic valve area/bsa, peak        0.54 cm^2/m^2 --------- velocity Velocity ratio, mean, LVOT/AV       0.23      --------- Aortic valve area, mean velocity     0.87 cm^2   --------- Aortic valve area/bsa, mean        0.47 cm^2/m^2 --------- velocity Aortic regurg pressure half-time     516  ms    ---------  Aorta                   Value     Reference Aortic root ID, ED            29  mm    ---------  Left atrium                Value     Reference LA ID, A-P, ES  48  mm    --------- LA ID/bsa, A-P          (H)   2.6  cm/m^2  <=2.2 LA volume, S               116  ml    --------- LA volume/bsa, S             62.8 ml/m^2  --------- LA volume, ES, 1-p  A4C          100  ml    --------- LA volume/bsa, ES, 1-p A4C        54.1 ml/m^2  --------- LA volume, ES, 1-p A2C          130  ml    --------- LA volume/bsa, ES, 1-p A2C        70.4 ml/m^2  ---------  Mitral valve               Value     Reference Mitral E-wave peak velocity        100  cm/s   --------- Mitral A-wave peak velocity        52  cm/s   --------- Mitral deceleration time     (H)   239  ms    150 - 230 Mitral peak gradient, D          4   mm Hg  --------- Mitral E/A ratio, peak          1.9      ---------  Right ventricle              Value     Reference RV s&', lateral, S             8.1  cm/s   ---------  Legend: (L) and (H) mark values outside specified reference range.  ------------------------------------------------------------------- Prepared and Electronically Authenticated by  Donato Schultz, M.D. 2017-02-01T13:13:32         *[place]*         *[facility]*            [address]            [place], [place] [address]              [phone]  ------------------------------------------------------------------- Stress Echocardiography  Patient:  Johnwesley, Lederman MR #:    [id number] Study Date: 04/10/2015 Gender:   M Age:    86 Height:   185.4 cm Weight:   68 kg BSA:    1.86 m^2 Pt. Status: Room:    3W15C  ADMITTING  [name], M.D. ATTENDING  [name], M.D. SONOGRAPHER Nolon Rod, RDCS PERFORMING  [facility], Inpatient ORDERING   Bhagat, Bhavinkumar REFERRING  [place], [place]  cc:  ------------------------------------------------------------------- LV EF: 15% -  20%  ------------------------------------------------------------------- Indications:    Aortic stenosis 424.1.  ------------------------------------------------------------------- Study Conclusions  - Left ventricle: The cavity size was moderately reduced. Wall thickness was normal. Systolic function was severely reduced. The estimated ejection fraction was in the range of 15% to 20%. Severe diffuse hypokinesis. - Aortic valve: Transvalvular velocity was increased less than expected, due to low cardiac output. There was moderate to severe stenosis. Valve area (VTI): 1.05 cm^2. Valve area (Vmax): 0.96 cm^2. Valve area (Vmean): 1.09 cm^2. - Staged echo: There was evidence of moderate myocardial recruitment with IV dobutamine. All wall segments appear at least partly viable, with the exception of the mid-distal anterior septum, which may be mostly scar.  Impressions:  -  There is moderate to severe aortic stenosis (&quot;low gradient aortic stenosis&quot; due to impaired LV function). Improvement in myocardial function is likely after treatment of aortic stenosis.  Dobutamine. Stress echocardiography. 2D. Birthdate: Patient birthdate: [birth year]. Age: Patient is 82 yr old. Sex: Gender: male.  BMI: 19.8 kg/m^2. Blood pressure:   134/77 Patient status: Inpatient. Study date: Study date: 04/10/2015. Study time: 10:58 AM.  -------------------------------------------------------------------  ------------------------------------------------------------------- Left ventricle: The cavity size was moderately reduced. Wall thickness was normal. Systolic function was severely reduced. The estimated ejection fraction was in the range of 15% to 20%. Severe diffuse hypokinesis.  ------------------------------------------------------------------- Aortic valve:  Probably trileaflet; moderately thickened, severely calcified leaflets. Doppler: Transvalvular velocity was increased less than expected, due to low cardiac output. There  was moderate to severe stenosis.  VTI ratio of LVOT to aortic valve: 0.31. Valve area (VTI): 1.05 cm^2. Indexed valve area (VTI): 0.57 cm^2/m^2. Peak velocity ratio of LVOT to aortic valve: 0.29. Valve area (Vmax): 0.96 cm^2. Indexed valve area (Vmax): 0.52 cm^2/m^2. Mean velocity ratio of LVOT to aortic valve: 0.32. Valve area (Vmean): 1.09 cm^2. Indexed valve area (Vmean): 0.59 cm^2/m^2. Mean gradient (S): 23 mm Hg. Peak gradient (S): 64 mm Hg.  ------------------------------------------------------------------- Baseline ECG: Normal. Normal sinus rhythm.  ------------------------------------------------------------------- Stress protocol:  +-----------------------+--+-----------+--------+ Stage         HRBP (mmHg) Symptoms +-----------------------+--+-----------+--------+ Baseline        64134/77 (96)None   +-----------------------+--+-----------+--------+ Dobutamine 5 ug/kg/min 71125/72 (90)-------- +-----------------------+--+-----------+--------+ Dobutamine 10 ug/kg/min78------------------- +-----------------------+--+-----------+--------+ Recovery; 1 min    77------------------- +-----------------------+--+-----------+--------+ Recovery; 2 min    76------------------- +-----------------------+--+-----------+--------+  ------------------------------------------------------------------- Stress results:  Maximal heart rate during stress was 78 bpm (57% of maximal predicted heart rate). The maximal predicted heart rate was 136 bpm.The target heart rate was achieved. The heart rate response to stress was normal. There was a normal resting blood pressure with an appropriate response to stress. The rate-pressure product for the peak heart rate and blood pressure was 8875 mm Hg/min. The patient experienced no chest pain during stress.  ------------------------------------------------------------------- Stress ECG:  No repolarization changes are seen. Isolated ventricular ectopy.  ------------------------------------------------------------------- Baseline:  - LV global systolic function was severely depressed. The estimated LV ejection fraction was 15%. - There was severe diffuse LV hypokinesis with distinct regional wall motion abnormalities. - Akinesis of the mid-apical anteroseptal LV myocardium. - AV peak gradient 39 mm Hg AV mean gradient 22 mm Hg AV area 1.00 cm sq.  Low dose 5 ug/kg/min:  - LV global systolic function was severely depressed and improved from the prior stage. The estimated LV ejection fraction was 25%. - No evidence for new LV regional wall motion abnormalities. - AV peak gradient 54 mm Hg AV mean gradient 29 mm Hg AV area 1.08 cm sq.  High dose 10 ug/kg/min:  - LV global systolic function was moderately to severely depressed and improved from the prior stage. The estimated LV ejection fraction was 30%. - No evidence for new LV regional wall motion abnormalities. - All wall segments show improvement from baseline except the anterior septum. - AV peak gradient 64 mm Hg AV mean gradient 31 mm Hg AV area 0.96 cm sq  ------------------------------------------------------------------- Stress echo results:   There was evidence of moderate myocardial recruitment with IV dobutamine. All wall segments appear at least partly viable, with the exception of the mid-distal anterior septum, which may be mostly scar. There is moderate to severe aortic stenosis (&quot;low gradient aortic stenosis&quot; due to impaired LV function). Improvement in myocardial  function is likely after treatment of aortic stenosis.  ------------------------------------------------------------------- Measurements  Left ventricle              Value     Reference LV ID, ED, PLAX chordal      (H)   63.67 mm    43 - 52 LV PW thickness, ED             10.31 mm    --------- IVS/LV PW ratio, ED            0.87      <=1.3 Stroke volume, 2D             72  ml    --------- Stroke volume/bsa, 2D           39  ml/m^2  ---------  Ventricular septum            Value     Reference IVS thickness, ED             8.94 mm    ---------  LVOT                   Value     Reference LVOT ID, S                20.7 mm    --------- LVOT area                 3.37 cm^2   --------- LVOT peak velocity, S           114  cm/s   --------- LVOT mean velocity, S           69.3 cm/s   --------- LVOT VTI, S                20.8 cm    --------- LVOT peak gradient, S           5   mm Hg  --------- Stroke volume (SV), LVOT DP        70  ml    --------- Stroke index (SV/bsa), LVOT DP      37.6 ml/m^2  ---------  Aortic valve               Value     Reference Aortic valve peak velocity, S       400  cm/s   --------- Aortic valve mean velocity, S       214  cm/s   --------- Aortic valve VTI, S            66.4 cm    --------- Aortic mean gradient, S          23  mm Hg  --------- Aortic peak gradient, S          64  mm Hg  --------- VTI ratio, LVOT/AV            0.31      --------- Aortic valve area, VTI          1.05 cm^2   --------- Aortic valve area/bsa, VTI        0.57 cm^2/m^2 --------- Velocity ratio, peak, LVOT/AV       0.29      --------- Aortic valve area, peak velocity     0.96 cm^2   --------- Aortic valve area/bsa, peak        0.52 cm^2/m^2 --------- velocity Velocity ratio, mean, LVOT/AV       0.32      ---------  Aortic valve area, mean  velocity     1.09 cm^2   --------- Aortic valve area/bsa, mean        0.59 cm^2/m^2 --------- velocity  Legend: (L) and (H) mark values outside specified reference range.  ------------------------------------------------------------------- Prepared and Electronically Authenticated by  Thurmon Fair, MD 2017-02-02T14:35:13  ADDENDUM REPORT: 04/11/2015 17:26 CLINICAL DATA: Aortic stenosis EXAM: Cardiac TAVR CT TECHNIQUE: The patient was scanned on a Philips 256 scanner. A 120 kV retrospective scan was triggered in the descending thoracic aorta at 111 HU's. Gantry rotation speed was 270 msecs and collimation was .9 mm. No beta blockade or nitro were given. The 3D data set was reconstructed in 5% intervals of the R-R cycle. Systolic and diastolic phases were analyzed on a dedicated work station using MPR, MIP and VRT modes. The patient received 80 cc of contrast. FINDINGS: Aortic Valve: Tri-leaflet and moderately calcified Mild MAC. No LAA thrombus Aorta: Mild root dilatation. Nearly circumferential calcification of the STJ. Moderate calcification of the arch with no atheroma and normal origin of the great vessels Sinotubular Junction: 30 mm Ascending Thoracic Aorta: 37 mm Aortic Arch: 32 mm Descending Thoracic Aorta: 28 mm Sinus of Valsalva Measurements: Non-coronary: 33 mm Right -coronary: 31 mm Left -coronary: 34 mm Coronary Artery Height above Annulus: Left Main: 14 mm Right Coronary: 16 mm Virtual Basal Annulus Measurements: Maximum/Minimum Diameter: 29.8 x 22.5 mm Perimeter: 82 mm Area: 509 mm2 Coronary Arteries: Sufficient height above annulus for deployment Optimum Fluoroscopic Angle for Delivery: RAO 2 degrees Cranial 0 degrees IMPRESSION: 1) Moderately calcified tri-leaflet aortic valve with annulus 509 mm2 suitable for a 26 mm Sapien 3 valve 2) Sufficient coronary height for delivery 3) Near circumferential calcification  of the STJ 4) Optimum angiographic angle for delivery RAO 2 degrees Cranial 0 degrees 5) No LAA thrombus 6) Moderate calcification of the aortic arch Charlton Haws Electronically Signed  By: Charlton Haws M.D.  On: 04/11/2015 17:26     Study Result     EXAM: OVER-READ INTERPRETATION CT CHEST  The following report is an over-read performed by radiologist Dr. Royal Piedra [facility] on 04/11/2015. This over-read does not include interpretation of cardiac or coronary anatomy or pathology. The coronary calcium score/coronary CTA interpretation by the cardiologist is attached.  COMPARISON: No priors.  FINDINGS: Extracardiac findings will be dictated under separate accession number for contemporaneously obtained CTA of the chest, abdomen and pelvis obtained on 04/11/2015.  IMPRESSION: See separate dictation for extracardiac findings condyle reported on contemporaneously obtained CTA of the chest, abdomen and pelvis dated 04/11/2015.  Electronically Signed: By: Trudie Reed M.D. On: 04/11/2015 14:45   CLINICAL DATA: 82 year old male with history of severe aortic stenosis. Preprocedural study prior to potential transcatheter aortic valve replacement (TAVR).  EXAM: CT ANGIOGRAPHY CHEST, ABDOMEN AND PELVIS  TECHNIQUE: Multidetector CT imaging through the chest, abdomen and pelvis was performed using the standard protocol during bolus administration of intravenous contrast. Multiplanar reconstructed images and MIPs were obtained and reviewed to evaluate the vascular anatomy.  CONTRAST: 75mL OMNIPAQUE IOHEXOL 350 MG/ML SOLN, 75mL OMNIPAQUE IOHEXOL 350 MG/ML SOLN  COMPARISON: No priors.  FINDINGS: CTA CHEST FINDINGS  Mediastinum/Lymph Nodes: Heart size is enlarged with left ventricular and left atrial dilatation. There is no significant pericardial fluid, thickening or pericardial calcification. There is atherosclerosis of the  thoracic aorta, the great vessels of the mediastinum and the coronary arteries, including calcified atherosclerotic plaque in the left main, left anterior descending, left circumflex and right coronary arteries. Status  post median sternotomy for CABG, including LIMA to the LAD. There is also a saphenous vein graft to the obtuse marginal distribution. Severe thickening calcification of the aortic valve. Calcification of the mitral annulus. No pathologically enlarged mediastinal or hilar lymph nodes. Esophagus is unremarkable in appearance. No axillary lymphadenopathy.  Lungs/Pleura: Moderate bilateral pleural effusions are simple in appearance layering dependently. These are associated with areas of passive atelectasis in the lower lobes of the lungs bilaterally. No acute consolidative airspace disease. There are some patchy areas of ground-glass attenuation and interlobular septal thickening, favored to reflect a background of very mild interstitial pulmonary edema. No suspicious appearing pulmonary nodules or masses are noted in the well aerated portions of the lungs.  Musculoskeletal/Soft Tissues: Median sternotomy wires. Healing nondisplaced fractures of the right fourth, fifth and sixth ribs anteriorly, as well as the right seventh rib laterally. There are no aggressive appearing lytic or blastic lesions noted in the visualized portions of the skeleton.  CTA ABDOMEN AND PELVIS FINDINGS  Hepatobiliary: The liver has a slightly shrunken appearance and nodular contour, suggestive of underlying cirrhosis. In the central aspect of the right lobe of the liver between segments 7 and 8 (image 133 of series 401) there is a somewhat ill-defined hypervascular area estimated to measure 1.9 x 2.3 cm. Immediately adjacent to this extending superiorly from this area there is a branching hypodensity, which appears likely to be within the distribution of the right hepatic vein. Findings are  concerning for potential small hepatocellular carcinoma with associated tumor thrombus in the right hepatic vein. There is an additional ill-defined hypervascular area in the inferior aspect of segment 5 of the liver measuring 1.7 x 2.8 cm (image 183 of series 401). No intra or extrahepatic biliary ductal dilatation. Numerous calcified gallstones lie dependently in the gallbladder. No current findings to suggest an acute cholecystitis at this time.  Pancreas: No pancreatic mass. No pancreatic ductal dilatation. No pancreatic or peripancreatic fluid or inflammatory changes.  Spleen: Unremarkable.  Adrenals/Urinary Tract: Bilateral adrenal glands are normal in appearance. Tiny simple cysts in the upper pole of the left kidney. Sub cm low-attenuation lesion in the upper pole the right kidney is too small to definitively characterize, but is also favored to represent a small cyst. No hydroureteronephrosis or perinephric stranding to indicate urinary tract obstruction at this time. Urinary bladder is normal in appearance.  Stomach/Bowel: Normal appearance of the stomach. No pathologic dilatation of small bowel or colon. The appendix is not confidently identified may be surgically absent. Regardless, there are no inflammatory changes noted adjacent to the cecum to suggest presence of an acute appendicitis at this time.  Vascular/Lymphatic: Extensive atherosclerosis throughout the abdominal and pelvic vasculature, with vascular findings and measurements pertinent to potential TAVR procedure, as detailed below. No aneurysm or dissection identified in the abdominal or pelvic vasculature. No lymphadenopathy noted in the abdomen or pelvis.  Reproductive: Prostate gland and seminal vesicles are unremarkable in appearance.  Other: No significant volume of ascites. No pneumoperitoneum.  Musculoskeletal: There are no aggressive appearing lytic or blastic lesions noted in the  visualized portions of the skeleton.  VASCULAR MEASUREMENTS PERTINENT TO TAVR:  AORTA:  Minimal Aortic Diameter - 12.7 x 13.8 mm  Severity of Aortic Calcification - moderate to severe  RIGHT PELVIS:  Right Common Iliac Artery -  Minimal Diameter - 9.1 x 7.8 mm  Tortuosity - mild  Calcification - moderate to severe  Right External Iliac Artery -  Minimal Diameter - 8.6 x  8.3 mm  Tortuosity - severe  Calcification - none  Right Common Femoral Artery -  Minimal Diameter - 7.9 x 6.0 mm  Tortuosity - mild  Calcification - mild  LEFT PELVIS:  Left Common Iliac Artery -  Minimal Diameter - 8.0 x 8.8 mm  Tortuosity - mild  Calcification - moderate to severe  Left External Iliac Artery -  Minimal Diameter - 8.4 x 8.1 mm  Tortuosity - mild  Calcification - none  Left Common Femoral Artery -  Minimal Diameter - 7.8 x 6.5 mm  Tortuosity - mild  Calcification - mild  Review of the MIP images confirms the above findings.  IMPRESSION: 1. Vascular findings and measurements pertinent to potential TAVR procedure, as detailed above. This patient does appear to have suitable pelvic arterial access bilaterally, however, left-sided vascular approach is considered far more optimal given the less severe tortuosity of the left external iliac artery. 2. Morphologic changes in the liver strongly suggestive of cirrhosis, with suspicious hypervascular area in the right lobe of the liver which is concerning for potential hepatocellular carcinoma. This appears to be associated with potential tumor thrombus in branches of the right hepatic vein, as detailed above. There is an additional small hypervascular area in segment 5 of the liver as well. Further evaluation of these findings with MRI of the abdomen with and without IV gadolinium (preferably Eovist) is strongly recommended in the near future. 3. The appearance of the chest  suggests mild congestive heart failure, including mild interstitial pulmonary edema and moderate bilateral pleural effusions. 4. Thickening calcification of the aortic valve, compatible with the reported clinical history of severe aortic stenosis. 5. Cardiomegaly with left ventricular and left atrial dilatation. 6. Cholelithiasis without evidence of acute cholecystitis at this time. 7. Multiple healing nondisplaced right-sided rib fractures, as above. 8. Additional incidental findings, as above. These results were called by telephone at the time of interpretation on 04/11/2015 at 4:10 pm to Dr. Tonny Bollman, who verbally acknowledged these results.   Electronically Signed  By: Trudie Reed M.D.  On: 04/11/2015 16:15  Impression:  This 82 year old gentleman has had a gradual decline since last May that is probably due to low gradient severe aortic stenosis. I think he has stage D symptomatic low EF, low gradient severe aortic stenosis with NYHA class III congestive heart failure symptoms. His echocardiograms since May 2016 have shown a progressive decline in his LV systolic function with a corresponding decline in the AV gradient. His EF was reportedly 50% in November/December and is now 20-25%. I have personally reviewed his echo and the aortic valve is severely calcified and immobile and looks like a severely stenotic valve. His recent cardiac cath reportedly showed patent grafts with adequate perfusion to all territories so ischemia does not appear to be the cause of his LV deterioration. His functional status has declined significantly with poor appetite and significant weight loss although he still gets up and moves around according to his family. He says he just wants to feel better. I don't ithink he is a candidate for redo sternotomy for open surgical AVR at 86 with poor LV function. TAVR may be a reasonable alternative. The positive response on the dobutamine echo may indicate  that we can expect some improvement in his EF after AVR. His cardiac CT shows that he is a candidate for a 26 mm Sapien 3 valve. His abdominal and pelvic CT show adequate transfemoral access.  The patient and his family were counseled  at length regarding treatment alternatives for management of severe symptomatic aortic stenosis. Alternative approaches such as conventional aortic valve replacement, transcatheter aortic valve replacement, and palliative medical therapy were compared and contrasted at length. The risks associated with conventional surgical aortic valve replacement were been discussed in detail, as were expectations for post-operative convalescence. Long-term prognosis with medical therapy was discussed.     I discussed transcatheter aortic valve replacement, what types of management strategies would be attempted intraoperatively in the event of life-threatening complications, including whether or not the patient would be considered a candidate for the use of cardiopulmonary bypass and/or conversion to open sternotomy for attempted surgical intervention.  The patient has been advised of a variety of complications that might develop including but not limited to risks of death, stroke, paravalvular leak, aortic dissection or other major vascular complications, aortic annulus rupture, device embolization, cardiac rupture or perforation, mitral regurgitation, acute myocardial infarction, arrhythmia, heart block or bradycardia requiring permanent pacemaker placement, congestive heart failure, respiratory failure, renal failure, pneumonia, infection, other late complications related to structural valve deterioration or migration, or other complications that might ultimately cause a temporary or permanent loss of functional independence or other long term morbidity.  The patient provides full informed consent for the procedure as described and all questions were answered.

## 2015-04-15 NOTE — H&P (View-Only) (Signed)
CARDIOLOGY CONSULT NOTE  Patient ID: Gary Beck, MRN: [id number], DOB/AGE: 10/19/1928 82 y.o. Admit date: 04/07/2015 Date of Consult: 04/10/2015  Primary Physician: [name], MD Primary Cardiologist: Dr [name]) Referring Physician: Dr Tresa Endo  Chief Complaint: Shortness of Breath/Weakness Reason for Consultation: Aortic Stenosis  HPI: 82 year old gentleman who transferred here for further evaluation of chest pain and mildly elevated troponin of 0.06. He has been followed regularly at [facility] in [place]. The patient has long-standing cardiac history and he underwent coronary bypass surgery in 2002. He has been followed for aortic stenosis, paroxysmal atrial fibrillation, hypertension, hyperlipidemia, and type 2 diabetes. The patient has been physically active and in fact he was still working until December 2016. Since that time, he reports a progressive course of weakness and exercise intolerance. He sustained a fall and had a significant amount of bruising/bleeding. His chronic oral anticoagulant was stopped at that time. He has undergone recent cardiac catheterization for further evaluation of his aortic stenosis. This demonstrated a mean transaortic valve gradient of 19 mmHg. His aortic valve area calculated to 0.93 cm. All of his bypass grafts were patent and there were no areas for revascularization. Ongoing medical therapy was recommended. Review of records show an echocardiogram from May 2016 with an estimated LVEF of 45-50%, severe thickening and limited excursion of the aortic valve leaflets, and peak and mean gradients of 52 and 28 mmHg with an estimated aortic valve area of 1.0 cm and peak instantaneous transvalvular velocity of 3.6 m/s with a dimensionless index of 0.25.  The multidisciplinary valve team is now asked to evaluate this patient with marked change in LV function, as his echocardiogram yesterday demonstrated an LVEF of 15-20%. The patient  was evaluated yesterday by Dr. Laneta Simmers, and further treatment options are being considered at this time. The patient is interviewed alone this morning, but his family members will be here a little later today. He reports symptoms of diminished appetite and 25 pound weight loss over the past year. He complains of exertional fatigue and shortness of breath. He has dizzy spells and has had near syncope. He did have chest pain prompting this hospital admission, but has been pain-free since arrival here.  Medical History:  Past Medical History  Diagnosis Date  . CAD (coronary artery disease)     Multivessel status post CABG 2002  . Essential hypertension   . Depression   . Type 2 diabetes mellitus (HCC)   . PAF (paroxysmal atrial fibrillation) (HCC)     On Sotalol (previously Eliquis - stopped 02/2015)  . Aortic stenosis     Moderate to severe May 2016  . Vitamin D deficiency   . Diabetic nephropathy (HCC)   . BPH (benign prostatic hyperplasia)   . GERD (gastroesophageal reflux disease)   . Chronic systolic heart failure (HCC)     LVEF 45-50% May 2016  . Medication intolerance     Amiodarone - nausea and emesis  . History of fall     Right wrist fracture and facial contusion 02/2015, possibly associated with orthostasis  . Iron deficiency anemia       Surgical History:  Past Surgical History  Procedure Laterality Date  . Cataract surgery Bilateral   . Coronary artery bypass graft  2002     [facility]  . Adenoidectomy, tonsillectomy and myringotomy with tube placement       Home Meds: Prior to Admission medications   Medication Sig Start Date End Date Taking? Authorizing Provider  amLODipine (NORVASC) 5 MG tablet Take 1 tablet (5 mg total) by mouth daily. 04/09/15   Bhavinkumar Bhagat, PA  aspirin EC 81 MG tablet Take 81 mg by mouth daily.   Yes Historical Provider, MD  atorvastatin (LIPITOR) 40 MG tablet Take 40 mg by mouth daily.   Yes Historical Provider, MD    DULoxetine (CYMBALTA) 60 MG capsule Take 120 mg by mouth daily.   Yes Historical Provider, MD  furosemide (LASIX) 20 MG tablet Take 20 mg by mouth daily as needed for fluid or edema.   Yes Historical Provider, MD  glimepiride (AMARYL) 4 MG tablet Take 4 mg by mouth daily with breakfast.   Yes Historical Provider, MD  isosorbide mononitrate (IMDUR) 30 MG 24 hr tablet Take 1 tablet (30 mg total) by mouth daily. 04/09/15   Bhavinkumar Bhagat, PA  metFORMIN (GLUCOPHAGE) 1000 MG tablet Take 1,000 mg by mouth 2 (two) times daily with a meal.   Yes Historical Provider, MD  nitroGLYCERIN (NITROSTAT) 0.4 MG SL tablet Place 1 tablet (0.4 mg total) under the tongue every 5 (five) minutes x 3 doses as needed for chest pain. 04/09/15   Bhavinkumar Bhagat, PA  omeprazole (PRILOSEC) 20 MG capsule Take 20 mg by mouth daily.   Yes Historical Provider, MD  sotalol (BETAPACE) 120 MG tablet Take 120 mg by mouth daily.   Yes Historical Provider, MD    Inpatient Medications:  . amLODipine  5 mg Oral Daily  . antiseptic oral rinse  7 mL Mouth Rinse BID  . aspirin EC  81 mg Oral Daily  . atorvastatin  40 mg Oral q1800  . DULoxetine  60 mg Oral Daily  . feeding supplement (ENSURE ENLIVE)  237 mL Oral BID BM  . feeding supplement (GLUCERNA SHAKE)  237 mL Oral TID BM  . furosemide  20 mg Oral Daily  . insulin aspart  0-15 Units Subcutaneous TID WC  . isosorbide mononitrate  30 mg Oral Daily  . pantoprazole  40 mg Oral Daily  . sotalol  120 mg Oral Daily   . heparin 900 Units/hr (04/10/15 0103)    Allergies:  Allergies  Allergen Reactions  . Penicillins Rash    Has patient had a PCN reaction causing immediate rash, facial/tongue/throat swelling, SOB or lightheadedness with hypotension: Yes Has patient had a PCN reaction causing severe rash involving mucus membranes or skin necrosis: No Has patient had a PCN reaction that required hospitalization No Has patient had a PCN reaction occurring within the last 10  years: No If all of the above answers are "NO", then may proceed with Cephalosporin use.     Social History   Social History  . Marital Status: Married    Spouse Name: N/A  . Number of Children: N/A  . Years of Education: N/A   Occupational History  . Not on file.   Social History Main Topics  . Smoking status: Former Smoker    Types: Cigarettes    Quit date: 03/09/1959  . Smokeless tobacco: Not on file  . Alcohol Use: No  . Drug Use: No  . Sexual Activity: Not on file   Other Topics Concern  . Not on file   Social History Narrative     Family History  Problem Relation Age of Onset  . Heart attack Father   . Hypertension Father   . Diabetes Mellitus II Brother      Review of Systems: General: negative for chills, fever, night sweats.  Positive for  weakness and weight loss  ENT: negative for rhinorrhea or epistaxis Cardiovascular: See history of present illness Dermatological: negative for rash Respiratory: negative for cough or wheezing, positive for shortness of breath GI: negative for nausea, vomiting, diarrhea, bright red blood per rectum, melena, or hematemesis, positive for diminished appetite GU: no hematuria, urgency, or frequency Neurologic: negative for visual changes, syncope, headache, or dizziness Heme: Positive for easy bruising Endo: negative for excessive thirst, thyroid disorder, or flushing Musculoskeletal: negative for joint pain or swelling, negative for myalgias  All other systems reviewed and are otherwise negative except as noted above.  Physical Exam: Blood pressure 137/78, pulse 62, temperature 98.3 F (36.8 C), temperature source Oral, resp. rate 16, height  (1.854 m), weight 68.04 kg (150 lb), SpO2 94 %. Pt is alert and oriented, pleasant elderly, thin male, in no distress HEENT: normal Neck: JVP normal. Carotid upstrokes normal with bilateral bruits. No thyromegaly. Lungs: equal expansion, clear bilaterally CV: Apex is  discrete and nondisplaced, RRR distant heart sounds but grade 3/6 harsh systolic murmur. I am unable to hear the A2 component of the second heart sound. Abd: soft, NT, +BS, no bruit, no hepatosplenomegaly Back: no CVA tenderness Ext: no C/C/E        DP/PT pulses intact and = Skin: warm and dry without rash Neuro: CNII-XII intact             Strength intact = bilaterally    Labs:  Recent Labs  04/07/15 2138 04/08/15 0231 04/08/15 0941  TROPONINI 0.06* 0.06* 0.05*   Lab Results  Component Value Date   WBC 5.4 04/10/2015   HGB 12.4* 04/10/2015   HCT 37.5* 04/10/2015   MCV 92.1 04/10/2015   PLT 220 04/10/2015    Recent Labs Lab 04/09/15 0604  NA 141  K 3.8  CL 100*  CO2 31  BUN 13  CREATININE 0.91  CALCIUM 8.1*  GLUCOSE 153*   Lab Results  Component Value Date   CHOL 142 04/08/2015   HDL 46 04/08/2015   LDLCALC 82 04/08/2015   TRIG 68 04/08/2015   No results found for: DDIMER  Radiology/Studies:  No results found.  Cardiac Studies: 2D Echo: Study Conclusions  - Left ventricle: The cavity size was moderately dilated. Systolic function was severely reduced. The estimated ejection fraction was in the range of 15% to 20%. Diffuse hypokinesis. Doppler parameters are consistent with a reversible restrictive pattern, indicative of decreased left ventricular diastolic compliance and/or increased left atrial pressure (grade 3 diastolic dysfunction). - Aortic valve: Trileaflet; severely thickened, severely calcified leaflets. Cusp separation was severely reduced. Findings suggestive of severe stenosis (low gradient, low output). There was mild regurgitation. Peak velocity (S): 270 cm/s. Mean gradient (S): 14 mm Hg. Valve area (VTI): 0.99 cm^2. Valve area (Vmax): 1 cm^2. Valve area (Vmean): 0.87 cm^2. - Mitral valve: Calcified annulus. There was moderate regurgitation. - Left atrium: The atrium was severely dilated. - Right atrium: The  atrium was mildly dilated.  STS Risk Calculator: Procedure: AV Replacement  Risk of Mortality: 6.646%  Morbidity or Mortality: 30.118%  Long Length of Stay: 14.496%  Short Length of Stay: 13.615%  Permanent Stroke: 2.128%  Prolonged Ventilation: 21.775%  DSW Infection: 0.362%  Renal Failure: 8.338%  Reoperation: 10.739%   ASSESSMENT AND PLAN:  82 year old gentleman with stage D, symptomatic low gradient low ejection fraction severe aortic stenosis. I have personally reviewed his echo images which demonstrate very severe LV systolic dysfunction and a heavily calcified aortic valve with  severe restriction in leaflet mobility. Outside records are reviewed which include a cardiac catheterization study demonstrating patency of his bypass grafts. His transaortic velocities have decreased over the past year, but this appears to be related to worsening LV systolic function. There is no other clear explanation for why his LV function has deteriorated so markedly and rapidly.  I have reviewed the natural history of aortic stenosis with the patient. We have discussed the limitations of medical therapy and the poor prognosis associated with symptomatic aortic stenosis. We have reviewed potential treatment options, including palliative medical therapy, conventional surgical aortic valve replacement, and transcatheter aortic valve replacement. We discussed treatment options in the context of this patient's specific comorbid medical conditions.   I agree with Dr Laneta Simmers that this elderly gentleman, who is functional but has many co-morbidities, would not be a candidate for redo conventional surgery. However, it might be reasonable to consider TAVR for treatment of his aortic stenosis. I have recommended a dobutamine stress echocardiogram to evaluate Myocardial reserve and better assess for true low-gradient severe AS. Pending those results, will counsel the patient as to whether TAVR or palliative medical therapy  may be more appropriate.   He will discuss logistical options with his family regarding whether he prefers further evaluation and treatment here or in [place]. If we proceed with TAVR evaluation, he will require CT studies of the heart and peripheral vessels as well as a second surgical opinion. All questions are answered. Further plans pending dobutamine echo results. Case discussed with Dr Tresa Endo and Dr Laneta Simmers at length.  Enzo Bi MD, [facility] 04/10/2015, 9:33 AM

## 2015-04-15 NOTE — Progress Notes (Signed)
CT surgery p.m. Rounds  Patient resting comfortably after T aVR procedure No bleeding from groin Heart rate 61st degree block Appears to be doing very well

## 2015-04-15 NOTE — Anesthesia Preprocedure Evaluation (Addendum)
Anesthesia Evaluation  Patient identified by MRN, date of birth, ID band Patient awake    Reviewed: Allergy & Precautions, NPO status , Patient's Chart, lab work & pertinent test results  Airway Mallampati: II   Neck ROM: full    Dental  (+) Edentulous Upper, Teeth Intact, Dental Advisory Given   Pulmonary former smoker,    breath sounds clear to auscultation       Cardiovascular hypertension, + CAD and + CABG  + dysrhythmias Atrial Fibrillation + Valvular Problems/Murmurs AS  Rhythm:irregular Rate:Normal  EF 15% on recent TTE.     Neuro/Psych Depression    GI/Hepatic GERD  ,  Endo/Other  diabetes, Type 2  Renal/GU      Musculoskeletal  (+) Arthritis ,   Abdominal   Peds  Hematology   Anesthesia Other Findings   Reproductive/Obstetrics                           Anesthesia Physical Anesthesia Plan  ASA: IV  Anesthesia Plan: General   Post-op Pain Management:    Induction: Intravenous  Airway Management Planned: Oral ETT  Additional Equipment: Arterial line, CVP, PA Cath, TEE and Ultrasound Guidance Line Placement  Intra-op Plan:   Post-operative Plan: Post-operative intubation/ventilation  Informed Consent: I have reviewed the patients History and Physical, chart, labs and discussed the procedure including the risks, benefits and alternatives for the proposed anesthesia with the patient or authorized representative who has indicated his/her understanding and acceptance.     Plan Discussed with: CRNA, Anesthesiologist and Surgeon  Anesthesia Plan Comments:         Anesthesia Quick Evaluation

## 2015-04-15 NOTE — Anesthesia Procedure Notes (Signed)
Procedure Name: Intubation Date/Time: 04/15/2015 3:08 PM Performed by: Jefm Miles E Pre-anesthesia Checklist: Patient identified, Emergency Drugs available, Suction available, Patient being monitored and Timeout performed Patient Re-evaluated:Patient Re-evaluated prior to inductionOxygen Delivery Method: Circle system utilized Preoxygenation: Pre-oxygenation with 100% oxygen Intubation Type: IV induction Ventilation: Mask ventilation without difficulty Laryngoscope Size: Mac and 3 Grade View: Grade I Tube type: Oral Tube size: 8.0 mm Number of attempts: 1 Airway Equipment and Method: Stylet Placement Confirmation: ETT inserted through vocal cords under direct vision,  positive ETCO2 and breath sounds checked- equal and bilateral Secured at: 22 cm Tube secured with: Tape Dental Injury: Teeth and Oropharynx as per pre-operative assessment

## 2015-04-15 NOTE — Op Note (Signed)
[facility]   MULTIDISCIPLINARY HEART VALVE TEAM   TAVR OPERATIVE NOTE Date of Procedure:  04/15/2015  Preoperative Diagnosis: Severe Aortic Stenosis   Postoperative Diagnosis: Same   Procedure:    Transcatheter Aortic Valve Replacement - Percutaneous Transfemoral Approach  Edwards Sapien 3 THV (size 26 mm, model # 9600TFX, serial # [id number])   Co-Surgeons:  Evelene Croon, MD and Tonny Bollman, MD  Assistants:   Tressie Stalker, MD  Anesthesiologist:  Dr Chaney Malling  Echocardiographer:  Dr Delton See  Pre-operative Echo Findings:  Severe aortic stenosis  Severe left ventricular systolic dysfunction   Post-operative Echo Findings:  trace paravalvular leak  unchanged left ventricular systolic function   BRIEF CLINICAL NOTE AND INDICATIONS FOR SURGERY  82 yo male with severe low-gradient aortic stenosis recently admitted for acute CHF exacerbation. He underwent dobutamine echocardiography confirming severe low-gradient aortic stenosis. He underwent CTA studies per protocol and has recently had echo and cardiac cath studies confirming bypass graft patency from his previous cardiac surgery. He now presents for TAVR for treatment of Stage D, severe low-gradient aortic stenosis.   During the course of the patient's preoperative work up they have been evaluated comprehensively by a multidisciplinary team of specialists coordinated through the Multidisciplinary Heart Valve Clinic in the [facility].  They have been demonstrated to suffer from symptomatic severe aortic stenosis as noted above. The patient has been counseled extensively as to the relative risks and benefits of all options for the treatment of severe aortic stenosis including long term medical therapy, conventional surgery for aortic valve replacement, and transcatheter aortic valve replacement.  The patient has been independently evaluated by two cardiac surgeons including Dr. Tyrone Sage  and Dr. Laneta Simmers, and they are felt to be at high risk for conventional surgical aortic valve replacement based upon a predicted risk of mortality using the Society of Thoracic Surgeons risk calculator of 6.5%. Both surgeons indicated the patient would be a poor candidate for conventional surgery (predicted risk of mortality >15% and/or predicted risk of permanent morbidity >50%) because of comorbidities including severe LV dysfunction, CHF, previous CABG, frailty, cirrhosis, and poor functional capacity. Based upon review of all of the patient's preoperative diagnostic tests they are felt to be candidate for transcatheter aortic valve replacement using the transfemoral approach as an alternative to high risk conventional surgery.    Following the decision to proceed with transcatheter aortic valve replacement, a discussion has been held regarding what types of management strategies would be attempted intraoperatively in the event of life-threatening complications, including whether or not the patient would be considered a candidate for the use of cardiopulmonary bypass and/or conversion to open sternotomy for attempted surgical intervention.  The patient has been advised of a variety of complications that might develop peculiar to this approach including but not limited to risks of death, stroke, paravalvular leak, aortic dissection or other major vascular complications, aortic annulus rupture, device embolization, cardiac rupture or perforation, acute myocardial infarction, arrhythmia, heart block or bradycardia requiring permanent pacemaker placement, congestive heart failure, respiratory failure, renal failure, pneumonia, infection, other late complications related to structural valve deterioration or migration, or other complications that might ultimately cause a temporary or permanent loss of functional independence or other long term morbidity.  The patient provides full informed consent for the procedure as  described and all questions were answered preoperatively.    DETAILS OF THE OPERATIVE PROCEDURE  PREPARATION:    The patient is brought to the operating room on  the above mentioned date and central monitoring was established by the anesthesia team including placement of Swan-Ganz catheter and radial arterial line. The patient is placed in the supine position on the operating table.  Intravenous antibiotics are administered. General endotracheal anesthesia is induced uneventfully.  A Foley catheter is placed.  Baseline transesophageal echocardiogram was performed. The patient's chest, abdomen, both groins, and both lower extremities are prepared and draped in a sterile manner. A time out procedure is performed.   PERIPHERAL ACCESS:    Using the modified Seldinger technique, femoral arterial and venous access was obtained with placement of 6 Fr sheaths on the left side.  A pigtail diagnostic catheter was passed through the left femoral arterial sheath under fluoroscopic guidance into the aortic root.  A temporary transvenous pacemaker catheter was passed through the left femoral venous sheath under fluoroscopic guidance into the right ventricle.  The pacemaker was tested to ensure stable lead placement and pacemaker capture. Aortic root angiography was performed in order to determine the optimal angiographic angle for valve deployment.   TRANSFEMORAL ACCESS:  A micropuncture technique is used to access the right femoral artery under fluoroscopic guidance.  Femoral angiography is performed to verify access in the common femoral artery. 2 Perclose devices are deployed at 10' and 2' positions to 'PreClose' the femoral artery. An 8 French sheath is placed and then an Amplatz Superstiff wire is advanced through the sheath. This is changed out for a 14 French transfemoral E-Sheath after progressively dilating over the Superstiff wire.  An AL-2 catheter was used to direct a straight-tip exchange length  wire across the native aortic valve into the left ventricle. This was exchanged out for a pigtail catheter and position was confirmed in the LV apex. Simultaneous LV and Ao pressures were recorded.  The pigtail catheter was exchanged for an Amplatz Extra-stiff wire in the LV apex.  Echocardiography was utilized to confirm appropriate wire position and no sign of entanglement in the mitral subvalvular apparatus.   BALLOON AORTIC VALVULOPLASTY:  Because of severe LV dysfunction and concern about risks of rapid pacing, valvuloplasty is deferred.  TRANSCATHETER HEART VALVE DEPLOYMENT:  An Edwards Sapien 3 transcatheter heart valve (size 26 mm, model #9600TFX, serial #[id number]) was prepared and crimped per manufacturer's guidelines, and the proper orientation of the valve is confirmed on the [employer] delivery system. The valve was advanced through the introducer sheath using normal technique until in an appropriate position in the abdominal aorta beyond the sheath tip. The balloon was then retracted and using the fine-tuning wheel was centered on the valve. The valve was then advanced across the aortic arch using appropriate flexion of the catheter. The valve was carefully positioned across the aortic valve annulus. The Commander catheter was retracted using normal technique. Once final position of the valve has been confirmed by angiographic assessment, the valve is deployed while temporarily holding ventilation and during rapid ventricular pacing to maintain systolic blood pressure < 50 mmHg and pulse pressure < 10 mmHg. The balloon inflation is held for >3 seconds after reaching full deployment volume. Once the balloon has fully deflated the balloon is retracted into the ascending aorta and valve function is assessed using echocardiography. There is felt to be trivial paravalvular leak and no central aortic insufficiency.  The patient's hemodynamic recovery following valve deployment is good.  The  deployment balloon and guidewire are both removed. Echo demostrated acceptable post-procedural gradients, stable mitral valve function, and trace aortic insufficiency.   PROCEDURE COMPLETION:  The sheath was removed and femoral artery closure is performed using the 2 previously deployed Perclose devices.  Protamine was administered once femoral arterial repair was complete. The temporary pacemaker, pigtail catheters and femoral sheaths were removed with manual pressure used for hemostasis.   The patient tolerated the procedure well and is transported to the surgical intensive care in stable condition. There were no immediate intraoperative complications. All sponge instrument and needle counts are verified correct at completion of the operation.   The patient received a total of 81 mL of intravenous contrast during the procedure.   Tonny Bollman, MD 04/15/2015 5:37 PM

## 2015-04-15 NOTE — Interval H&P Note (Signed)
History and Physical Interval Note:  04/15/2015 1:59 PM  Gary Beck  has presented today for surgery, with the diagnosis of SEVERE AS  The various methods of treatment have been discussed with the patient and family. After consideration of risks, benefits and other options for treatment, the patient has consented to  Procedure(s): TRANSCATHETER AORTIC VALVE REPLACEMENT, TRANSFEMORAL (N/A) TRANSESOPHAGEAL ECHOCARDIOGRAM (TEE) (N/A) as a surgical intervention .  The patient's history has been reviewed, patient examined, no change in status, stable for surgery.  I have reviewed the patient's chart and labs.  Questions were answered to the patient's satisfaction.     Alleen Borne

## 2015-04-15 NOTE — Progress Notes (Signed)
  Echocardiogram 2D Echocardiogram has been performed.  Arvil Chaco 04/15/2015, 4:30 PM

## 2015-04-15 NOTE — Interval H&P Note (Signed)
History and Physical Interval Note:  04/15/2015 12:15 PM  Errin Chewning  has presented today for surgery, with the diagnosis of SEVERE AS  The various methods of treatment have been discussed with the patient and family. After consideration of risks, benefits and other options for treatment, the patient has consented to  Procedure(s): TRANSCATHETER AORTIC VALVE REPLACEMENT, TRANSFEMORAL (N/A) TRANSESOPHAGEAL ECHOCARDIOGRAM (TEE) (N/A) as a surgical intervention .  The patient's history has been reviewed, patient examined, no change in status, stable for surgery.  I have reviewed the patient's chart and labs.  Questions were answered to the patient's satisfaction.    All studies reviewed. Have discussed case extensively with Dr Laneta Simmers. Plan for transfemoral TAVR via right femoral approach with 26 mm Sapien 3 valve today. Reviewed CT findings with patient and his 2 children today at length. They understand liver findings and we will plan on obtaining dedicated MRI study after TAVR.  Tonny Bollman

## 2015-04-16 ENCOUNTER — Encounter (HOSPITAL_COMMUNITY): Payer: Self-pay | Admitting: Cardiovascular Disease

## 2015-04-16 ENCOUNTER — Inpatient Hospital Stay (HOSPITAL_COMMUNITY): Payer: Medicare (Managed Care)

## 2015-04-16 ENCOUNTER — Other Ambulatory Visit: Payer: Self-pay

## 2015-04-16 DIAGNOSIS — I340 Nonrheumatic mitral (valve) insufficiency: Secondary | ICD-10-CM

## 2015-04-16 DIAGNOSIS — Z952 Presence of prosthetic heart valve: Secondary | ICD-10-CM

## 2015-04-16 DIAGNOSIS — I350 Nonrheumatic aortic (valve) stenosis: Secondary | ICD-10-CM

## 2015-04-16 DIAGNOSIS — Z954 Presence of other heart-valve replacement: Secondary | ICD-10-CM

## 2015-04-16 LAB — BASIC METABOLIC PANEL
ANION GAP: 7 (ref 5–15)
BUN: 11 mg/dL (ref 6–20)
CHLORIDE: 105 mmol/L (ref 101–111)
CO2: 25 mmol/L (ref 22–32)
Calcium: 8 mg/dL — ABNORMAL LOW (ref 8.9–10.3)
Creatinine, Ser: 0.87 mg/dL (ref 0.61–1.24)
GFR calc Af Amer: >60 mL/min (ref >60)
GFR calc non Af Amer: >60 mL/min (ref >60)
Glucose, Bld: 119 mg/dL — ABNORMAL HIGH (ref 65–99)
POTASSIUM: 4.3 mmol/L (ref 3.5–5.1)
SODIUM: 137 mmol/L (ref 135–145)

## 2015-04-16 LAB — GLUCOSE, CAPILLARY
GLUCOSE-CAPILLARY: 102 mg/dL — AB (ref 65–99)
GLUCOSE-CAPILLARY: 186 mg/dL — AB (ref 65–99)
Glucose-Capillary: 133 mg/dL — ABNORMAL HIGH (ref 65–99)
Glucose-Capillary: 120 mg/dL — ABNORMAL HIGH (ref 65–99)
Glucose-Capillary: 111 mg/dL — ABNORMAL HIGH (ref 65–99)

## 2015-04-16 LAB — CBC
HCT: 33.3 % — ABNORMAL LOW (ref 39.0–52.0)
HEMOGLOBIN: 10.5 g/dL — AB (ref 13.0–17.0)
MCH: 29.1 pg (ref 26.0–34.0)
MCHC: 31.5 g/dL (ref 30.0–36.0)
MCV: 92.2 fL (ref 78.0–100.0)
Platelets: 178 10*3/uL (ref 150–400)
RBC: 3.61 MIL/uL — AB (ref 4.22–5.81)
RDW: 14.9 % (ref 11.5–15.5)
WBC: 7.5 10*3/uL (ref 4.0–10.5)

## 2015-04-16 LAB — HEMOGLOBIN A1C
HEMOGLOBIN A1C: 6.1 % — AB (ref 4.8–5.6)
MEAN PLASMA GLUCOSE: 128 mg/dL

## 2015-04-16 LAB — MAGNESIUM: MAGNESIUM: 2.3 mg/dL (ref 1.7–2.4)

## 2015-04-16 MED ORDER — LISINOPRIL 10 MG PO TABS
10.0000 mg | ORAL_TABLET | Freq: Every day | ORAL | Status: DC
  Administered 2015-04-16 – 2015-04-18 (×3): 10 mg via ORAL
  Filled 2015-04-16 (×3): qty 1

## 2015-04-16 MED ORDER — ASPIRIN EC 325 MG PO TBEC
325.0000 mg | DELAYED_RELEASE_TABLET | Freq: Every day | ORAL | Status: DC
  Administered 2015-04-16 – 2015-04-18 (×3): 325 mg via ORAL
  Filled 2015-04-16 (×3): qty 1

## 2015-04-16 MED ORDER — FUROSEMIDE 40 MG PO TABS
40.0000 mg | ORAL_TABLET | Freq: Every day | ORAL | Status: AC
  Administered 2015-04-16 – 2015-04-18 (×3): 40 mg via ORAL
  Filled 2015-04-16 (×3): qty 1

## 2015-04-16 MED ORDER — FAMOTIDINE 20 MG PO TABS
20.0000 mg | ORAL_TABLET | Freq: Two times a day (BID) | ORAL | Status: DC
  Administered 2015-04-16 – 2015-04-18 (×5): 20 mg via ORAL
  Filled 2015-04-16 (×5): qty 1

## 2015-04-16 MED ORDER — MOVING RIGHT ALONG BOOK
Freq: Once | Status: AC
  Administered 2015-04-16: 1
  Filled 2015-04-16: qty 1

## 2015-04-16 MED ORDER — ACETAMINOPHEN 325 MG PO TABS: 650.0000 mg | ORAL_TABLET | Freq: Four times a day (QID) | ORAL | Status: DC | PRN

## 2015-04-16 MED ORDER — ONDANSETRON HCL 4 MG/2ML IJ SOLN
4.0000 mg | Freq: Four times a day (QID) | INTRAMUSCULAR | Status: DC | PRN
  Administered 2015-04-17: 4 mg via INTRAVENOUS
  Filled 2015-04-16: qty 2

## 2015-04-16 MED ORDER — SODIUM CHLORIDE 0.9% FLUSH: 3.0000 mL | INTRAVENOUS | Status: DC | PRN

## 2015-04-16 MED ORDER — DOCUSATE SODIUM 100 MG PO CAPS
200.0000 mg | ORAL_CAPSULE | Freq: Every day | ORAL | Status: DC
  Administered 2015-04-16 – 2015-04-17 (×2): 200 mg via ORAL
  Filled 2015-04-16 (×3): qty 2

## 2015-04-16 MED ORDER — BISACODYL 5 MG PO TBEC: 10.0000 mg | DELAYED_RELEASE_TABLET | Freq: Every day | ORAL | Status: DC | PRN

## 2015-04-16 MED ORDER — INSULIN ASPART 100 UNIT/ML ~~LOC~~ SOLN
0.0000 [IU] | Freq: Three times a day (TID) | SUBCUTANEOUS | Status: DC
  Administered 2015-04-16: 2 [IU] via SUBCUTANEOUS
  Administered 2015-04-16: 4 [IU] via SUBCUTANEOUS

## 2015-04-16 MED ORDER — BISACODYL 10 MG RE SUPP: 10.0000 mg | Freq: Every day | RECTAL | Status: DC | PRN

## 2015-04-16 MED ORDER — SODIUM CHLORIDE 0.9% FLUSH
3.0000 mL | Freq: Two times a day (BID) | INTRAVENOUS | Status: DC
  Administered 2015-04-16 – 2015-04-18 (×5): 3 mL via INTRAVENOUS

## 2015-04-16 MED ORDER — POTASSIUM CHLORIDE CRYS ER 20 MEQ PO TBCR
20.0000 meq | EXTENDED_RELEASE_TABLET | Freq: Every day | ORAL | Status: AC
  Administered 2015-04-16 – 2015-04-18 (×3): 20 meq via ORAL
  Filled 2015-04-16 (×3): qty 1

## 2015-04-16 MED ORDER — SODIUM CHLORIDE 0.9 % IV SOLN: 250.0000 mL | INTRAVENOUS | Status: DC | PRN

## 2015-04-16 MED ORDER — TRAMADOL HCL 50 MG PO TABS: 50.0000 mg | ORAL_TABLET | ORAL | Status: DC | PRN

## 2015-04-16 MED ORDER — ONDANSETRON HCL 4 MG PO TABS: 4.0000 mg | ORAL_TABLET | Freq: Four times a day (QID) | ORAL | Status: DC | PRN

## 2015-04-16 NOTE — Progress Notes (Signed)
1 Day Post-Op Procedure(s) (LRB): TRANSCATHETER AORTIC VALVE REPLACEMENT, TRANSFEMORAL (N/A) TRANSESOPHAGEAL ECHOCARDIOGRAM (TEE) (N/A) Subjective:  No complaints  Objective: Vital signs in last 24 hours: Temp:  [96.6 F (35.9 C)-98.4 F (36.9 C)] 98.4 F (36.9 C) (02/08 0700) Pulse Rate:  [50-60] 58 (02/08 0700) Cardiac Rhythm:  [-] Sinus bradycardia (02/08 0400) Resp:  [9-20] 16 (02/08 0700) BP: (95-150)/(48-70) 118/48 mmHg (02/08 0700) SpO2:  [92 %-99 %] 94 % (02/08 0700) Arterial Line BP: (112-159)/(37-53) 142/48 mmHg (02/08 0700) Weight:  [69.854 kg (154 lb)-72.3 kg (159 lb 6.3 oz)] 72.3 kg (159 lb 6.3 oz) (02/08 0500)  Hemodynamic parameters for last 24 hours: PAP: (30-46)/(11-23) 42/19 mmHg CO:  [4.1 L/min-5.3 L/min] 4.6 L/min CI:  [2.1 L/min/m2-2.7 L/min/m2] 2.4 L/min/m2  Intake/Output from previous day: 02/07 0701 - 02/08 0700 In: 2870.8 [P.O.:120; I.V.:2380.8; IV Piggyback:370] Out: 1005 [Urine:1005] Intake/Output this shift:    General appearance: alert and cooperative Neurologic: intact Heart: regular rate and rhythm, S1, S2 normal, no murmur, click, rub or gallop Lungs: clear to auscultation bilaterally Extremities: extremities normal, atraumatic, no cyanosis or edema, both feet pink and warm. Wound: groin sites look good.  Lab Results:  Recent Labs  04/15/15 2300 04/15/15 2311 04/16/15 0528  WBC 7.1  --  7.5  HGB 10.6* 10.9* 10.5*  HCT 33.3* 32.0* 33.3*  PLT 174  --  178   BMET:  Recent Labs  04/15/15 2311 04/16/15 0528  NA 138 137  K 4.3 4.3  CL 100* 105  CO2  --  25  GLUCOSE 91 119*  BUN 14 11  CREATININE 0.80 0.87  CALCIUM  --  8.0*    PT/INR:  Recent Labs  04/15/15 1734  LABPROT 15.5*  INR 1.21   ABG    Component Value Date/Time   PHART 7.369 04/15/2015 1733   HCO3 26.6* 04/15/2015 1733   TCO2 25 04/15/2015 2311   O2SAT 92.0 04/15/2015 1733   CBG (last 3)   Recent Labs  04/15/15 1945 04/15/15 1949 04/16/15 0410   GLUCAP 52* 63* 102*    Assessment/Plan: S/P Procedure(s) (LRB): TRANSCATHETER AORTIC VALVE REPLACEMENT, TRANSFEMORAL (N/A) TRANSESOPHAGEAL ECHOCARDIOGRAM (TEE) (N/A)  POD 1  Hemodynamically stable with normal PA pressures and cardiac output. Sinus 60. Will remove swan, sleeve and arterial line.   History of PAF treated with Sotalol. Intolerant of Amio with N/V. With HR 60 will hold off on Sotalol for now. Hold BB.  DM: glucose under good control. Resume oral agents when eating.  Chronic systolic heart failure: Will benefit from starting ACE I slowly. Will give a dose of lasix today. Weight is 5 lbs over preop.  Start Plavix and ASA  Echo today  Mobilize and transfer to 2W.       LOS: 1 day    Alleen Borne 04/16/2015

## 2015-04-16 NOTE — Op Note (Signed)
[facility]   MULTIDISCIPLINARY HEART VALVE TEAM   TAVR OPERATIVE NOTE   Date of Procedure:  04/16/2015  Preoperative Diagnosis: Severe Aortic Stenosis   Postoperative Diagnosis: Same   Procedure:    Transcatheter Aortic Valve Replacement - Percutaneous Right Transfemoral Approach  Edwards Sapien 3 THV (size 26 mm, model # 9600TFX, serial # [id number])   Co-Surgeons:  Alleen Borne, MD and Tonny Bollman, MD  Assistants:   Salvatore Decent. Cornelius Moras, MD  Anesthesiologist:  Achille Rich, MD  Echocardiographer:  Tobias Alexander, MD  Pre-operative Echo Findings:   severe aortic stenosis   severe  left ventricular systolic dysfunction   Post-operative Echo Findings:  Trace paravalvular leak  Unchanged severe left ventricular systolic dysfunction    BRIEF CLINICAL NOTE AND INDICATIONS FOR SURGERY  This 82 year old gentleman has had a gradual decline since last May that is probably due to low gradient severe aortic stenosis. I think he has stage D symptomatic low EF, low gradient severe aortic stenosis with NYHA class III congestive heart failure symptoms. His echocardiograms since May 2016 have shown a progressive decline in his LV systolic function with a corresponding decline in the AV gradient. His EF was reportedly 50% in November/December and is now 20-25%. I have personally reviewed his echo and the aortic valve is severely calcified and immobile and looks like a severely stenotic valve. His recent cardiac cath reportedly showed patent grafts with adequate perfusion to all territories so ischemia does not appear to be the cause of his LV deterioration. His functional status has declined significantly with poor appetite and significant weight loss although he still gets up and moves around according to his family. He says he just wants to feel better. I don't ithink he is a candidate for redo sternotomy for open surgical AVR at 86 with poor LV function. TAVR may be a  reasonable alternative. The positive response on the dobutamine echo may indicate that we can expect some improvement in his EF after AVR. His cardiac CT shows that he is a candidate for a 26 mm Sapien 3 valve. His abdominal and pelvic CT show adequate transfemoral access.  He was seen in the hospital by Dr. Excell Seltzer and underwent a dobutamine stress echo showing a baseline EF of 15-20% with a mean AV gradient of 22 mm Hg that increased to 31 mm Hg with dobutamine 10 mcg and EF improved to 30% suggesting that he does have low gradient, low EF severe AS.  Following the decision to proceed with transcatheter aortic valve replacement, a discussion has been held regarding what types of management strategies would be attempted intraoperatively in the event of life-threatening complications, including whether or not the patient would be considered a candidate for the use of cardiopulmonary bypass and/or conversion to open sternotomy for attempted surgical intervention.  The patient has been advised of a variety of complications that might develop peculiar to this approach including but not limited to risks of death, stroke, paravalvular leak, aortic dissection or other major vascular complications, aortic annulus rupture, device embolization, cardiac rupture or perforation, acute myocardial infarction, arrhythmia, heart block or bradycardia requiring permanent pacemaker placement, congestive heart failure, respiratory failure, renal failure, pneumonia, infection, other late complications related to structural valve deterioration or migration, or other complications that might ultimately cause a temporary or permanent loss of functional independence or other long term morbidity.  The patient provides full informed consent for the procedure as described and all questions were answered preoperatively.  DETAILS OF THE OPERATIVE PROCEDURE  PREPARATION:    The patient is brought to the operating room on the above  mentioned date and central monitoring was established by the anesthesia team including placement of Swan-Ganz catheter and radial arterial line. The patient is placed in the supine position on the operating table.  Intravenous antibiotics are administered. General endotracheal anesthesia is induced uneventfully.  A Foley catheter is placed.  Baseline transesophageal echocardiogram was performed. The patient's chest, abdomen, both groins, and both lower extremities are prepared and draped in a sterile manner. A time out procedure is performed.   Peripheral access, right transfemoral access for heart valve insertion, and balloon aortic valvuloplasty were documented in the note by Dr. Excell Seltzer.   TRANSCATHETER HEART VALVE DEPLOYMENT:   An Edwards Sapien 3 transcatheter heart valve (size 26 mm, model #9600TFX, serial #[id number]) was prepared and crimped per manufacturer's guidelines, and the proper orientation of the valve is confirmed on the [employer] delivery system. The valve was advanced through the introducer sheath using normal technique until in an appropriate position in the abdominal aorta beyond the sheath tip. The balloon was then retracted and using the fine-tuning wheel was centered on the valve. The valve was then advanced across the aortic arch using appropriate flexion of the catheter. The valve was carefully positioned across the aortic valve annulus. The Commander catheter was retracted using normal technique. Once final position of the valve has been confirmed by angiographic assessment, the valve is deployed while temporarily holding ventilation and during rapid ventricular pacing to maintain systolic blood pressure < 50 mmHg and pulse pressure < 10 mmHg. The balloon inflation is held for >3 seconds after reaching full deployment volume. Once the balloon has fully deflated the balloon is retracted into the ascending aorta and valve function is assessed using echocardiography. There is  felt to be trace paravalvular leak and no central aortic insufficiency.  The patient's hemodynamic recovery following valve deployment is good.  The deployment balloon and guidewire are both removed. Echo demostrated acceptable post-procedural gradients, stable mitral valve function, and trace aortic insufficiency.   PROCEDURE COMPLETION:   The sheath was removed and femoral artery closure performed by Dr Excell Seltzer. Please see his separate report for details. Protamine was administered once femoral arterial repair was complete. The temporary pacemaker, pigtail catheters and femoral sheaths were removed with manual pressure used for hemostasis.   The patient tolerated the procedure well and is transported to the surgical intensive care in stable condition. There were no immediate intraoperative complications. All sponge instrument and needle counts are verified correct at completion of the operation.   No blood products were administered during the operation.  The patient received a total of 81 mL of intravenous contrast during the procedure.   Alleen Borne, MD 04/16/2015 12:31 PM

## 2015-04-16 NOTE — Progress Notes (Signed)
Patient lying in bed, wife present at bedside. No pain, distress or needs expressed at this time. Call light within reach.

## 2015-04-16 NOTE — Progress Notes (Signed)
Utilization Review Completed.  

## 2015-04-16 NOTE — Progress Notes (Signed)
1350 Came to see pt. He stated walked over at transfer and eating lunch now. Has walker at home if needed. Encouraged to walk with staff later. Will follow up tomorrow. Luetta Nutting RN BSN 04/16/2015 1:47 PM

## 2015-04-16 NOTE — Progress Notes (Signed)
Transfer report received from 2S at 1153 and pt arrived to the unit at 1215 via ambulation from 2s to this unit. Pt oriented to the unit and room; pt on 2L oxygen, VSS, telemetry applied and verified with CCMD; groin incision level 0, clean, dry, and intact. No s/s hematoma, stain or active bleeding. No pressure ulcer or other wounds noted except bilateral ecchymosis to BUE. Pt educated on fall and safety precautions/prevention. Pt was due to void but voided x2 during shift. Pt sitting up in chair with call light within reach and family at side. Reported off to oncoming RN.

## 2015-04-16 NOTE — Progress Notes (Signed)
  Echocardiogram 2D Echocardiogram has been performed.  Gary Beck 04/16/2015, 11:20 AM

## 2015-04-16 NOTE — Progress Notes (Signed)
    Subjective:  Feels well. No CP or dyspnea.   Objective:  Vital Signs in the last 24 hours: Temp:  [96.6 F (35.9 C)-98.6 F (37 C)] 97.7 F (36.5 C) (02/08 1221) Pulse Rate:  [50-66] 66 (02/08 1221) Resp:  [9-20] 17 (02/08 1221) BP: (95-147)/(47-103) 147/54 mmHg (02/08 1221) SpO2:  [87 %-99 %] 97 % (02/08 1221) Arterial Line BP: (112-159)/(37-53) 144/46 mmHg (02/08 1000) Weight:  [72.3 kg (159 lb 6.3 oz)] 72.3 kg (159 lb 6.3 oz) (02/08 0500)  Intake/Output from previous day: 02/07 0701 - 02/08 0700 In: 2870.8 [P.O.:120; I.V.:2380.8; IV Piggyback:370] Out: 1005 [Urine:1005]  Physical Exam: Pt is alert and oriented, elderly male in NAD HEENT: normal Neck: JVP - normal Lungs: CTA bilaterally CV: RRR with soft SEM at the RUSB Abd: soft, NT, Positive BS, no hepatomegaly Ext: no C/C/E, distal pulses intact and equal, bilateral groin sites are clear Skin: warm/dry no rash   Lab Results:  Recent Labs  04/15/15 2300 04/15/15 2311 04/16/15 0528  WBC 7.1  --  7.5  HGB 10.6* 10.9* 10.5*  PLT 174  --  178    Recent Labs  04/15/15 2311 04/16/15 0528  NA 138 137  K 4.3 4.3  CL 100* 105  CO2  --  25  GLUCOSE 91 119*  BUN 14 11  CREATININE 0.80 0.87   No results for input(s): TROPONINI in the last 72 hours.  Invalid input(s): CK, MB  Cardiac Studies: 2D Echo (post-op) is pending  Tele: Personally reviewed: sinus rhythm  Assessment/Plan:  1. Severe low-gradient AS: POD #1 from TAVR. Good recovery without immediate complications. ASA/Plavix started. Echo today. Pt walked hall this am without problems  2. Acute on chronic systolic heart failure: LVEF severely depressed. IV lasix given and meds slowly titrated. Holding sotalol this am with slow HR.  3. CAD s/p CABG: stable, no angina.   4. Paroxysmal atrial fibrillation: oral anticoagulation had been stopped prior to presentation here because of falling. Seems reasonable to continue ASA and plavix as he is in  sinus rhythm.  Tonny Bollman, M.D. 04/16/2015, 12:53 PM

## 2015-04-17 ENCOUNTER — Inpatient Hospital Stay (HOSPITAL_COMMUNITY): Payer: Medicare (Managed Care)

## 2015-04-17 DIAGNOSIS — I350 Nonrheumatic aortic (valve) stenosis: Principal | ICD-10-CM

## 2015-04-17 LAB — BASIC METABOLIC PANEL
ANION GAP: 6 (ref 5–15)
BUN: 11 mg/dL (ref 6–20)
CALCIUM: 8 mg/dL — AB (ref 8.9–10.3)
CHLORIDE: 102 mmol/L (ref 101–111)
CO2: 26 mmol/L (ref 22–32)
Creatinine, Ser: 0.99 mg/dL (ref 0.61–1.24)
GFR calc non Af Amer: >60 mL/min (ref >60)
GLUCOSE: 139 mg/dL — AB (ref 65–99)
Potassium: 4 mmol/L (ref 3.5–5.1)
Sodium: 134 mmol/L — ABNORMAL LOW (ref 135–145)

## 2015-04-17 LAB — CBC
HEMATOCRIT: 31.7 % — AB (ref 39.0–52.0)
HEMOGLOBIN: 10.7 g/dL — AB (ref 13.0–17.0)
MCH: 30.9 pg (ref 26.0–34.0)
MCHC: 33.8 g/dL (ref 30.0–36.0)
MCV: 91.6 fL (ref 78.0–100.0)
Platelets: 140 10*3/uL — ABNORMAL LOW (ref 150–400)
RBC: 3.46 MIL/uL — ABNORMAL LOW (ref 4.22–5.81)
RDW: 14.7 % (ref 11.5–15.5)
WBC: 7.2 10*3/uL (ref 4.0–10.5)

## 2015-04-17 LAB — GLUCOSE, CAPILLARY: Glucose-Capillary: 132 mg/dL — ABNORMAL HIGH (ref 65–99)

## 2015-04-17 MED ORDER — TRAMADOL HCL 50 MG PO TABS: 50.0000 mg | ORAL_TABLET | ORAL | Status: DC | PRN

## 2015-04-17 MED ORDER — SOTALOL HCL 80 MG PO TABS
80.0000 mg | ORAL_TABLET | Freq: Two times a day (BID) | ORAL | Status: DC
  Administered 2015-04-17 – 2015-04-18 (×3): 80 mg via ORAL
  Filled 2015-04-17 (×3): qty 1

## 2015-04-17 MED ORDER — TAMSULOSIN HCL 0.4 MG PO CAPS
0.4000 mg | ORAL_CAPSULE | Freq: Every day | ORAL | Status: DC
  Administered 2015-04-17: 0.4 mg via ORAL
  Filled 2015-04-17: qty 1

## 2015-04-17 NOTE — Discharge Summary (Signed)
Physician Discharge Summary       [address] [place].[address]       [place] [id number]             [phone]    Patient ID: Gary Beck MRN: [id number] DOB/AGE: 04/20/1928 82 y.o.  Admit date: 04/15/2015 Discharge date: 04/18/2015  Admission Diagnosis: Severe aortic stenosis  Acive Diagnoses:  1. CAD (coronary artery disease)Multivessel status post CABG 2002 2. Essential hypertension 3. Type 2 diabetes mellitus (HCC) 4. PAF (paroxysmal atrial fibrillation) (HCC)On Sotalol (previously Eliquis - stopped 02/2015) 5. Depression 6. Diabetic nephropathy (HCC) 7. Vitamin D deficiency 8. BPH (benign prostatic hyperplasia) 9. GERD (gastroesophageal reflux disease) 10. Chronic systolic heart failure (HCC)LVEF 45-50% May 2016 11. Iron deficiency anemia 12. History of fallRight wrist fracture and facial contusion 02/2015, possibly associated with orthostasis   Procedure (s):   Transcatheter Aortic Valve Replacement - Percutaneous Right Transfemoral Approach Edwards Sapien 3 THV (size 26 mm, model # 9600TFX, serial # [id number]) by Dr. Laneta Simmers on 04/16/2015.  History of Presenting Illness: The patient is an 82 year old gentleman from [place], [place] with a history of hypertension, hyperlipidemia, type 2 DM, PAF on Eliquis, coronary artery disease, s/p CABG x 3 in 2002 and known moderate to severe aortic stenosis. The patient's primary cardiologist is Dr. [name] in [place]. I have interviewed the patient and his son and daughter and have reviewed the records available in the paper chart from [place]. He reports his problems starting in May 2016 when he was diagnosed with new onset atrial fibrillation. About that time he began having some exertional fatigue, shortness of breath and episodes of dizziness. 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 He was treated with antiarrhythmic drugs and Eliquis and eventually  underwent DCCV. He underwent an echo sometime last summer that reportedly showed moderate to severe AS with a mean AV gradient of 28 mm Hg and a peak of 52 mm Hg with an AVA of 1 cm2. The EF at that time was 45-50%. From the cardiology notes it was felt that his symptoms were likely due to the aortic stenosis but he was not felt to be a candidate for TAVR since his mean gradient was only 28 mm Hg. His symptoms of dizziness and exertional fatigue/SOB continued to progress and he also noted some episodes of intermittent chest pain. Reading through the notes from [place] it sounds like another echo was done around November 2016 which again showed moderate to severe AS with a mean AV gradient of 21 mm Hg and a peak of 44 mm Hg with an AVA of 0.93 cm2. His symptoms were still felt to be due to his AS but he was still not felt to be a TAVR candidate because his peak velocity was only 3.3 m/sec. A cath was done in December to see if he had recurrent coronary or graft stenoses for consideration of open surgical AVR and CABG. The cath report shows a mean AV gradient of 19 mm Hg with a calculated AVA of 1.2 cm2. LVEDP was 18. CI was 2.3 by thermodilution and 2.8 by Fick. Coronary angiography showed severe diffuse coronary disease with patent LIMA to the LAD, patent SVG's to the LCX and Diagonal. The LV showed normal wall motion with an EF of 50%.  He recently presented to [facility] with chest pain and was noted to have a mildly elevated troponin of 0.06. An attempt was  made to transfer him to [facility] in [place] but there were no beds and he eventually was transferred to [facility] for further evaluation. His Troponin has remained 0.06 and ECG showed sinus rhythm with non-specified intraventricular block. An echo today showed an LVEF of 15-20% with diffuse hypokinesis, grade 3 diastolic dysfunction. The aortic valve is trileaflet and severely thickened and calcified with severely reduced cusp separation  consistent with severe AS. The mean gradient was 14 mm Hg with a DI of 0.26. There is moderate MR with a severely dilated LA. The RV appeared normal.  His son reports progressive exertional fatigue and shortness of breath. He says that his father frequently gets dizzy and looks like he is going to pass out when he stands up. The patient says that he does ok walking around in the house but has been sedentary recently. He can't do any strenuous activity. He has poor appetite and has lost 25 lbs over the past year. He has had bilateral ankle edema.   He was seen in the hospital by Dr. Excell Seltzer and underwent a dobutamine stress echo showing a baseline EF of 15-20% with a mean AV gradient of 22 mm Hg that increased to 31 mm Hg with dobutamine 10 mcg and EF improved to 30% suggesting that he does have low gradient, low EF severe AS. The patient and his family were counseled at length regarding treatment alternatives for management of severe symptomatic aortic stenosis. Alternative approaches such as conventional aortic valve replacement, transcatheter aortic valve replacement, and palliative medical therapy were compared and contrasted at length. The risks associated with conventional surgical aortic valve replacement were been discussed in detail, as were expectations for post-operative convalescence. Long-term prognosis with medical therapy was discussed. He underwent TAVR via right percutaneous femoral approach on 04/15/2015.  Brief Hospital Course:  The patient was extubated the evening of surgery without difficulty. He remained afebrile and hemodynamically stable. Theone Murdoch, a line, chest tubes, and foley were removed early in the post operative course. He has a history of PAF. Will not be restarted on oral anticoagulation as with history of falls. He will continue on aspirin and Plavix as long as maintaining sinus rhythm. Per Dr. Excell Seltzer, he will be continued on Plavix for 6 months. He was not started on a  beta blocker initially to secondary to bradycardia and heart block.  Cardiology was started on Sotalol 80 mg bid. EKG done 2/10 showed sinus bradycardia and first degree heart block. As discussed with cardiology, Sotalol 80 mg bid to be continued. He had acute on chronic systolic heart failure and was given Lasix. He had ABL anemia. He did not require a post op transfusion. He/she was weaned off the insulin drip. The patient's HGA1C pre op was 6.1.   He is likely pre diabetic and may follow up with his medical doctor after discharger.The patient was felt surgically stable for transfer from the ICU to [facility] for further convalescence on 04/16/2015. He continues to progress with cardiac rehab. He was on 2 liters of oxygen via [place]. He was weaned  to room air. He has been tolerating a diet and has had a bowel movement. The patient is felt surgically stable for discharge today. He will see Dr. Excell Seltzer in follow up then resume cardiology care with Dr. [name] in [place].  Latest Vital Signs: Blood pressure 104/47, pulse 71, temperature 98.6 F (37 C), temperature source Oral, resp. rate 17, height 6\' 1"  (1.854 m), weight 159 lb 1.6 oz (  72.167 kg), SpO2 94 %.  Physical Exam: Cardiovascular: RRR Pulmonary: Clear to auscultation bilaterally; no rales, wheezes, or rhonchi. Abdomen: Soft, non tender, bowel sounds present. Extremities: No extremity edema and both feet are warm Wounds: Clean and dry. No erythema or signs of infection.  Discharge Condition:Stable and discharged to home  Recent laboratory studies:  Lab Results  Component Value Date   WBC 7.2 04/17/2015   HGB 10.7* 04/17/2015   HCT 31.7* 04/17/2015   MCV 91.6 04/17/2015   PLT 140* 04/17/2015   Lab Results  Component Value Date   NA 134* 04/17/2015   K 4.0 04/17/2015   CL 102 04/17/2015   CO2 26 04/17/2015   CREATININE 0.99 04/17/2015   GLUCOSE 139* 04/17/2015    Diagnostic Studies:  Ct Coronary Morp W/cta Cor W/score W/ca W/cm &/or  Wo/cm  04/11/2015  ADDENDUM REPORT: 04/11/2015 17:26 CLINICAL DATA:  Aortic stenosis EXAM: Cardiac TAVR CT TECHNIQUE: The patient was scanned on a Philips 256 scanner. A 120 kV retrospective scan was triggered in the descending thoracic aorta at 111 HU's. Gantry rotation speed was 270 msecs and collimation was .9 mm. No beta blockade or nitro were given. The 3D data set was reconstructed in 5% intervals of the R-R cycle. Systolic and diastolic phases were analyzed on a dedicated work station using MPR, MIP and VRT modes. The patient received 80 cc of contrast. FINDINGS: Aortic Valve: Tri-leaflet and moderately calcified Mild MAC. No LAA thrombus Aorta: Mild root dilatation. Nearly circumferential calcification of the STJ. Moderate calcification of the arch with no atheroma and normal origin of the great vessels Sinotubular Junction:  30 mm Ascending Thoracic Aorta:  37 mm Aortic Arch:  32 mm Descending Thoracic Aorta:  28 mm Sinus of Valsalva Measurements: Non-coronary:  33 mm Right -coronary:  31 mm Left -coronary:  34 mm Coronary Artery Height above Annulus: Left Main:  14 mm Right Coronary:  16 mm Virtual Basal Annulus Measurements: Maximum/Minimum Diameter:  29.8 x 22.5 mm Perimeter:  82 mm Area:  509 mm2 Coronary Arteries:  Sufficient height above annulus for deployment Optimum Fluoroscopic Angle for Delivery: RAO 2 degrees Cranial 0 degrees IMPRESSION: 1) Moderately calcified tri-leaflet aortic valve with annulus 509 mm2 suitable for a 26 mm Sapien 3 valve 2) Sufficient coronary height for delivery 3) Near circumferential calcification of the STJ 4) Optimum angiographic angle for delivery RAO 2 degrees Cranial 0 degrees 5) No LAA thrombus 6) Moderate calcification of the aortic arch Charlton Haws Electronically Signed   By: Charlton Haws M.D.   On: 04/11/2015 17:26  04/11/2015  EXAM: OVER-READ INTERPRETATION  CT CHEST The following report is an over-read performed by radiologist Dr. Royal Piedra [facility] on 04/11/2015. This over-read does not include interpretation of cardiac or coronary anatomy or pathology. The coronary calcium score/coronary CTA interpretation by the cardiologist is attached. COMPARISON:  No priors. FINDINGS: Extracardiac findings will be dictated under separate accession number for contemporaneously obtained CTA of the chest, abdomen and pelvis obtained on 04/11/2015. IMPRESSION: See separate dictation for extracardiac findings condyle reported on contemporaneously obtained CTA of the chest, abdomen and pelvis dated 04/11/2015. Electronically Signed: By: Trudie Reed M.D. On: 04/11/2015 14:45   Dg Chest Port 1 View  04/16/2015  CLINICAL DATA:  Aortic valve replacement. EXAM: PORTABLE CHEST 1 VIEW COMPARISON:  04/15/2015 . FINDINGS: Swan-Ganz catheter noted in stable position. Prior CABG and aortic valve replacement. Cardiomegaly with normal pulmonary vascularity. Interim partial clearing of bilateral pulmonary  edema with mild residual in the lung bases. Atelectatic changes in both lung bases. Small bilateral pleural effusions cannot be excluded. No pneumothorax. IMPRESSION: 1. Swan-Ganz catheter stable position. 2. Prior CABG and aortic valve replacement. 3. Cardiomegaly with interim partial clearing of bilateral pulmonary edema. Mild residual edema in the lung bases. Tent bibasilar atelectasis . Electronically Signed   By: Maisie Fus  Register   On: 04/16/2015 07:26    Ct Angio Chest Aorta W/cm &/or Wo/cm  04/11/2015  CLINICAL DATA:  82 year old male with history of severe aortic stenosis. Preprocedural study prior to potential transcatheter aortic valve replacement (TAVR). EXAM: CT ANGIOGRAPHY CHEST, ABDOMEN AND PELVIS TECHNIQUE: Multidetector CT imaging through the chest, abdomen and pelvis was performed using the standard protocol during bolus administration of intravenous contrast. Multiplanar reconstructed images and MIPs were obtained and reviewed to evaluate the vascular  anatomy. CONTRAST:  75mL OMNIPAQUE IOHEXOL 350 MG/ML SOLN, 75mL OMNIPAQUE IOHEXOL 350 MG/ML SOLN COMPARISON:  No priors. FINDINGS: CTA CHEST FINDINGS Mediastinum/Lymph Nodes: Heart size is enlarged with left ventricular and left atrial dilatation. There is no significant pericardial fluid, thickening or pericardial calcification. There is atherosclerosis of the thoracic aorta, the great vessels of the mediastinum and the coronary arteries, including calcified atherosclerotic plaque in the left main, left anterior descending, left circumflex and right coronary arteries. Status post median sternotomy for CABG, including LIMA to the LAD. There is also a saphenous vein graft to the obtuse marginal distribution. Severe thickening calcification of the aortic valve. Calcification of the mitral annulus. No pathologically enlarged mediastinal or hilar lymph nodes. Esophagus is unremarkable in appearance. No axillary lymphadenopathy. Lungs/Pleura: Moderate bilateral pleural effusions are simple in appearance layering dependently. These are associated with areas of passive atelectasis in the lower lobes of the lungs bilaterally. No acute consolidative airspace disease. There are some patchy areas of ground-glass attenuation and interlobular septal thickening, favored to reflect a background of very mild interstitial pulmonary edema. No suspicious appearing pulmonary nodules or masses are noted in the well aerated portions of the lungs. Musculoskeletal/Soft Tissues: Median sternotomy wires. Healing nondisplaced fractures of the right fourth, fifth and sixth ribs anteriorly, as well as the right seventh rib laterally. There are no aggressive appearing lytic or blastic lesions noted in the visualized portions of the skeleton. CTA ABDOMEN AND PELVIS FINDINGS Hepatobiliary: The liver has a slightly shrunken appearance and nodular contour, suggestive of underlying cirrhosis. In the central aspect of the right lobe of the liver  between segments 7 and 8 (image 133 of series 401) there is a somewhat ill-defined hypervascular area estimated to measure 1.9 x 2.3 cm. Immediately adjacent to this extending superiorly from this area there is a branching hypodensity, which appears likely to be within the distribution of the right hepatic vein. Findings are concerning for potential small hepatocellular carcinoma with associated tumor thrombus in the right hepatic vein. There is an additional ill-defined hypervascular area in the inferior aspect of segment 5 of the liver measuring 1.7 x 2.8 cm (image 183 of series 401). No intra or extrahepatic biliary ductal dilatation. Numerous calcified gallstones lie dependently in the gallbladder. No current findings to suggest an acute cholecystitis at this time. Pancreas: No pancreatic mass. No pancreatic ductal dilatation. No pancreatic or peripancreatic fluid or inflammatory changes. Spleen: Unremarkable. Adrenals/Urinary Tract: Bilateral adrenal glands are normal in appearance. Tiny simple cysts in the upper pole of the left kidney. Sub cm low-attenuation lesion in the upper pole the right kidney is too small to definitively characterize,  but is also favored to represent a small cyst. No hydroureteronephrosis or perinephric stranding to indicate urinary tract obstruction at this time. Urinary bladder is normal in appearance. Stomach/Bowel: Normal appearance of the stomach. No pathologic dilatation of small bowel or colon. The appendix is not confidently identified may be surgically absent. Regardless, there are no inflammatory changes noted adjacent to the cecum to suggest presence of an acute appendicitis at this time. Vascular/Lymphatic: Extensive atherosclerosis throughout the abdominal and pelvic vasculature, with vascular findings and measurements pertinent to potential TAVR procedure, as detailed below. No aneurysm or dissection identified in the abdominal or pelvic vasculature. No lymphadenopathy  noted in the abdomen or pelvis. Reproductive: Prostate gland and seminal vesicles are unremarkable in appearance. Other: No significant volume of ascites.  No pneumoperitoneum. Musculoskeletal: There are no aggressive appearing lytic or blastic lesions noted in the visualized portions of the skeleton. VASCULAR MEASUREMENTS PERTINENT TO TAVR: AORTA: Minimal Aortic Diameter -  12.7 x 13.8 mm Severity of Aortic Calcification -  moderate to severe RIGHT PELVIS: Right Common Iliac Artery - Minimal Diameter - 9.1 x 7.8 mm Tortuosity - mild Calcification - moderate to severe Right External Iliac Artery - Minimal Diameter - 8.6 x 8.3 mm Tortuosity - severe Calcification - none Right Common Femoral Artery - Minimal Diameter - 7.9 x 6.0 mm Tortuosity - mild Calcification - mild LEFT PELVIS: Left Common Iliac Artery - Minimal Diameter - 8.0 x 8.8 mm Tortuosity - mild Calcification - moderate to severe Left External Iliac Artery - Minimal Diameter - 8.4 x 8.1 mm Tortuosity - mild Calcification - none Left Common Femoral Artery - Minimal Diameter - 7.8 x 6.5 mm Tortuosity - mild Calcification - mild Review of the MIP images confirms the above findings. IMPRESSION: 1. Vascular findings and measurements pertinent to potential TAVR procedure, as detailed above. This patient does appear to have suitable pelvic arterial access bilaterally, however, left-sided vascular approach is considered far more optimal given the less severe tortuosity of the left external iliac artery. 2. Morphologic changes in the liver strongly suggestive of cirrhosis, with suspicious hypervascular area in the right lobe of the liver which is concerning for potential hepatocellular carcinoma. This appears to be associated with potential tumor thrombus in branches of the right hepatic vein, as detailed above. There is an additional small hypervascular area in segment 5 of the liver as well. Further evaluation of these findings with MRI of the abdomen with and  without IV gadolinium (preferably Eovist) is strongly recommended in the near future. 3. The appearance of the chest suggests mild congestive heart failure, including mild interstitial pulmonary edema and moderate bilateral pleural effusions. 4. Thickening calcification of the aortic valve, compatible with the reported clinical history of severe aortic stenosis. 5. Cardiomegaly with left ventricular and left atrial dilatation. 6. Cholelithiasis without evidence of acute cholecystitis at this time. 7. Multiple healing nondisplaced right-sided rib fractures, as above. 8. Additional incidental findings, as above. These results were called by telephone at the time of interpretation on 04/11/2015 at 4:10 pm to Dr. Tonny Bollman, who verbally acknowledged these results. Electronically Signed   By: Trudie Reed M.D.   On: 04/11/2015 16:15   Ct Angio Abd/pel W/ And/or W/o  04/11/2015  CLINICAL DATA:  82 year old male with history of severe aortic stenosis. Preprocedural study prior to potential transcatheter aortic valve replacement (TAVR). EXAM: CT ANGIOGRAPHY CHEST, ABDOMEN AND PELVIS TECHNIQUE: Multidetector CT imaging through the chest, abdomen and pelvis was performed using the standard protocol during  bolus administration of intravenous contrast. Multiplanar reconstructed images and MIPs were obtained and reviewed to evaluate the vascular anatomy. CONTRAST:  75mL OMNIPAQUE IOHEXOL 350 MG/ML SOLN, 75mL OMNIPAQUE IOHEXOL 350 MG/ML SOLN COMPARISON:  No priors. FINDINGS: CTA CHEST FINDINGS Mediastinum/Lymph Nodes: Heart size is enlarged with left ventricular and left atrial dilatation. There is no significant pericardial fluid, thickening or pericardial calcification. There is atherosclerosis of the thoracic aorta, the great vessels of the mediastinum and the coronary arteries, including calcified atherosclerotic plaque in the left main, left anterior descending, left circumflex and right coronary arteries. Status  post median sternotomy for CABG, including LIMA to the LAD. There is also a saphenous vein graft to the obtuse marginal distribution. Severe thickening calcification of the aortic valve. Calcification of the mitral annulus. No pathologically enlarged mediastinal or hilar lymph nodes. Esophagus is unremarkable in appearance. No axillary lymphadenopathy. Lungs/Pleura: Moderate bilateral pleural effusions are simple in appearance layering dependently. These are associated with areas of passive atelectasis in the lower lobes of the lungs bilaterally. No acute consolidative airspace disease. There are some patchy areas of ground-glass attenuation and interlobular septal thickening, favored to reflect a background of very mild interstitial pulmonary edema. No suspicious appearing pulmonary nodules or masses are noted in the well aerated portions of the lungs. Musculoskeletal/Soft Tissues: Median sternotomy wires. Healing nondisplaced fractures of the right fourth, fifth and sixth ribs anteriorly, as well as the right seventh rib laterally. There are no aggressive appearing lytic or blastic lesions noted in the visualized portions of the skeleton. CTA ABDOMEN AND PELVIS FINDINGS Hepatobiliary: The liver has a slightly shrunken appearance and nodular contour, suggestive of underlying cirrhosis. In the central aspect of the right lobe of the liver between segments 7 and 8 (image 133 of series 401) there is a somewhat ill-defined hypervascular area estimated to measure 1.9 x 2.3 cm. Immediately adjacent to this extending superiorly from this area there is a branching hypodensity, which appears likely to be within the distribution of the right hepatic vein. Findings are concerning for potential small hepatocellular carcinoma with associated tumor thrombus in the right hepatic vein. There is an additional ill-defined hypervascular area in the inferior aspect of segment 5 of the liver measuring 1.7 x 2.8 cm (image 183 of series  401). No intra or extrahepatic biliary ductal dilatation. Numerous calcified gallstones lie dependently in the gallbladder. No current findings to suggest an acute cholecystitis at this time. Pancreas: No pancreatic mass. No pancreatic ductal dilatation. No pancreatic or peripancreatic fluid or inflammatory changes. Spleen: Unremarkable. Adrenals/Urinary Tract: Bilateral adrenal glands are normal in appearance. Tiny simple cysts in the upper pole of the left kidney. Sub cm low-attenuation lesion in the upper pole the right kidney is too small to definitively characterize, but is also favored to represent a small cyst. No hydroureteronephrosis or perinephric stranding to indicate urinary tract obstruction at this time. Urinary bladder is normal in appearance. Stomach/Bowel: Normal appearance of the stomach. No pathologic dilatation of small bowel or colon. The appendix is not confidently identified may be surgically absent. Regardless, there are no inflammatory changes noted adjacent to the cecum to suggest presence of an acute appendicitis at this time. Vascular/Lymphatic: Extensive atherosclerosis throughout the abdominal and pelvic vasculature, with vascular findings and measurements pertinent to potential TAVR procedure, as detailed below. No aneurysm or dissection identified in the abdominal or pelvic vasculature. No lymphadenopathy noted in the abdomen or pelvis. Reproductive: Prostate gland and seminal vesicles are unremarkable in appearance. Other: No significant volume  of ascites.  No pneumoperitoneum. Musculoskeletal: There are no aggressive appearing lytic or blastic lesions noted in the visualized portions of the skeleton. VASCULAR MEASUREMENTS PERTINENT TO TAVR: AORTA: Minimal Aortic Diameter -  12.7 x 13.8 mm Severity of Aortic Calcification -  moderate to severe RIGHT PELVIS: Right Common Iliac Artery - Minimal Diameter - 9.1 x 7.8 mm Tortuosity - mild Calcification - moderate to severe Right External  Iliac Artery - Minimal Diameter - 8.6 x 8.3 mm Tortuosity - severe Calcification - none Right Common Femoral Artery - Minimal Diameter - 7.9 x 6.0 mm Tortuosity - mild Calcification - mild LEFT PELVIS: Left Common Iliac Artery - Minimal Diameter - 8.0 x 8.8 mm Tortuosity - mild Calcification - moderate to severe Left External Iliac Artery - Minimal Diameter - 8.4 x 8.1 mm Tortuosity - mild Calcification - none Left Common Femoral Artery - Minimal Diameter - 7.8 x 6.5 mm Tortuosity - mild Calcification - mild Review of the MIP images confirms the above findings. IMPRESSION: 1. Vascular findings and measurements pertinent to potential TAVR procedure, as detailed above. This patient does appear to have suitable pelvic arterial access bilaterally, however, left-sided vascular approach is considered far more optimal given the less severe tortuosity of the left external iliac artery. 2. Morphologic changes in the liver strongly suggestive of cirrhosis, with suspicious hypervascular area in the right lobe of the liver which is concerning for potential hepatocellular carcinoma. This appears to be associated with potential tumor thrombus in branches of the right hepatic vein, as detailed above. There is an additional small hypervascular area in segment 5 of the liver as well. Further evaluation of these findings with MRI of the abdomen with and without IV gadolinium (preferably Eovist) is strongly recommended in the near future. 3. The appearance of the chest suggests mild congestive heart failure, including mild interstitial pulmonary edema and moderate bilateral pleural effusions. 4. Thickening calcification of the aortic valve, compatible with the reported clinical history of severe aortic stenosis. 5. Cardiomegaly with left ventricular and left atrial dilatation. 6. Cholelithiasis without evidence of acute cholecystitis at this time. 7. Multiple healing nondisplaced right-sided rib fractures, as above. 8. Additional  incidental findings, as above. These results were called by telephone at the time of interpretation on 04/11/2015 at 4:10 pm to Dr. Tonny Bollman, who verbally acknowledged these results. Electronically Signed   By: Trudie Reed M.D.   On: 04/11/2015 16:15       Discharge Instructions    Amb Referral to Cardiac Rehabilitation    Complete by:  As directed   Diagnosis:  Valve Replacement/Repair Comment - referrring to [facility]          Discharge Medications:   Medication List    STOP taking these medications        amLODipine 5 MG tablet  Commonly known as:  NORVASC     isosorbide mononitrate 30 MG 24 hr tablet  Commonly known as:  IMDUR     nitroGLYCERIN 0.4 MG SL tablet  Commonly known as:  NITROSTAT      TAKE these medications        acetaminophen 325 MG tablet  Commonly known as:  TYLENOL  Take 2 tablets (650 mg total) by mouth every 6 (six) hours as needed for mild pain.     aspirin EC 81 MG tablet  Take 81 mg by mouth daily.     atorvastatin 40 MG tablet  Commonly known as:  LIPITOR  Take 40 mg  by mouth daily.     clopidogrel 75 MG tablet  Commonly known as:  PLAVIX  Take 1 tablet (75 mg total) by mouth daily with breakfast.     DULoxetine 60 MG capsule  Commonly known as:  CYMBALTA  Take 120 mg by mouth daily. Taking 60 mg in AM and 60 mg in PM     furosemide 20 MG tablet  Commonly known as:  LASIX  Take 20 mg by mouth daily as needed for fluid or edema.     glimepiride 4 MG tablet  Commonly known as:  AMARYL  Take 4 mg by mouth daily with breakfast.     lisinopril 10 MG tablet  Commonly known as:  PRINIVIL,ZESTRIL  Take 1 tablet (10 mg total) by mouth daily.     metFORMIN 1000 MG tablet  Commonly known as:  GLUCOPHAGE  Take 1,000 mg by mouth 2 (two) times daily with a meal.     omeprazole 20 MG capsule  Commonly known as:  PRILOSEC  Take 20 mg by mouth daily.     sotalol 80 MG tablet  Commonly known as:  BETAPACE  Take 1 tablet  (80 mg total) by mouth 2 (two) times daily.     tamsulosin 0.4 MG Caps capsule  Commonly known as:  FLOMAX  Take 1 capsule (0.4 mg total) by mouth daily after supper.       The patient has been discharged on:   1.Beta Blocker:  Yes [ x  ]                              No   [   ]                              If No, reason:  2.Ace Inhibitor/ARB: Yes [  x ]                                     No  [    ]                                     If No, reason:  3.Statin:   Yes [ x  ]                  No  [   ]                  If No, reason:  4.Ecasa:  Yes  [ x  ]                  No   [   ]                  If No, reason:  Follow Up Appointments: Follow-up Information    Follow up with Tonny Bollman, MD On 05/19/2015.   Specialty:  Cardiology   Why:  Echo to be done at 9:30 am   Contact information:   [address] [place] [address] [phone]       Follow up with Tonny Bollman, MD On 05/19/2015.   Specialty:  Cardiology   Why:  Appointment time is at 10:30 am  Contact information:   [address] [place] [address] [phone]       Signed: Doree Fudge MPA-C 04/18/2015, 3:08 PM

## 2015-04-17 NOTE — Progress Notes (Signed)
    Subjective:  Feels good. No chest pain or dyspnea. Walked 600 ft today.  Objective:  Vital Signs in the last 24 hours: Temp:  [98.6 F (37 C)-98.8 F (37.1 C)] 98.6 F (37 C) (02/09 0415) Pulse Rate:  [65-68] 65 (02/09 0415) Resp:  [17-18] 17 (02/09 0415) BP: (118-130)/(50-56) 118/50 mmHg (02/09 0415) SpO2:  [94 %-95 %] 95 % (02/09 0415) Weight:  [73.2 kg (161 lb 6 oz)] 73.2 kg (161 lb 6 oz) (02/09 0415)  Intake/Output from previous day: 02/08 0701 - 02/09 0700 In: -  Out: 800 [Urine:800]  Physical Exam: Pt is alert and oriented, elderly male in NAD HEENT: normal Neck: JVP - normal Lungs: CTA bilaterally CV: RRR with soft systolic ejection murmur at the RUSB Abd: soft, NT, Positive BS, no hepatomegaly Ext: no C/C/E, distal pulses intact and equal Skin: warm/dry no rash   Lab Results:  Recent Labs  04/16/15 0528 04/17/15 0529  WBC 7.5 7.2  HGB 10.5* 10.7*  PLT 178 140*    Recent Labs  04/16/15 0528 04/17/15 0529  NA 137 134*  K 4.3 4.0  CL 105 102  CO2 25 26  GLUCOSE 119* 139*  BUN 11 11  CREATININE 0.87 0.99   No results for input(s): TROPONINI in the last 72 hours.  Invalid input(s): CK, MB  Cardiac Studies: 2D Echo (post-op study): Study Conclusions  - Left ventricle: The cavity size was normal. Systolic function was severely reduced. The estimated ejection fraction was in the range of 20% to 25%. Diffuse hypokinesis. There is akinesis of the anteroseptal and anterolateral myocardium. Doppler parameters are consistent with a reversible restrictive pattern, indicative of decreased left ventricular diastolic compliance and/or increased left atrial pressure (grade 3 diastolic dysfunction). - Aortic valve: TAVR - Edwards Sapien 3 THV (size 26mm) There was mild perivalvular regurgitation (2 o&'clock position). - Mitral valve: Calcified annulus. There was mild regurgitation. - Left atrium: The atrium was moderately dilated. -  Tricuspid valve: There was severe regurgitation. - Pulmonary arteries: PA peak pressure: 58 mm Hg (S). - Pericardium, extracardiac: There was a left pleural effusion.  Impressions:  - EF has mildly increased when compared to prior. (Prior to TAVR).  Tele: Sinus rhythm, personally reviewed  Assessment/Plan:  1. Acute on chronic systolic heart failure, improving 2. Severe low-gradient AS, Post-op echo study reviewed and valve function looks good 3. CAD s/p CABG: no angina 4. PAF: discussion as per note yesterday. Resume sotalol. Maintaining sinus. Frail and elderly, recent fall. Defer ultimate decision to his primary cardiologist. ASA and plavix post-TAVR for now.   Tonny Bollman, M.D. 04/17/2015, 12:54 PM

## 2015-04-17 NOTE — Discharge Instructions (Signed)
ACTIVITY AND EXERCISE °• Daily activity and exercise are an important part °of your recovery. People recover at different rates °depending on their general health and type of °valve procedure. °• Most people require six to 10 weeks to feel °recovered. °• No lifting, pushing, pulling more than 10 pounds °(examples to avoid: groceries, vacuuming, °gardening, golfing): °- For one week with a procedure through the groin. °- For six weeks for procedures through the chest °wall. °- For three months for procedures through the °breast-bone. °• After the initial healing process of the access site, °we recommend cardiac rehabilitation for all TAVR °patients. Cardiac rehabilitation will help you: °- Rebuild stamina, strength and balance. °- Learn how to participate in activities safely, as well °as help you regain confidence to do so. °- Return to activities of daily living and leisure. °• Discuss attending cardiac rehabilitation at your °follow-up appointment  ° °DRIVING °• Do not drive for four weeks after the date of your °procedure. °• If you have been told by your doctor in the past °that you may not drive, you must talk with him/her °before you begin driving again. °• When you resume driving, you must have someone °with you. ° °HYGIENE °If you had a femoral (leg) procedure, you may take a shower when you return home. After the shower, pat the °site dry. Do NOT use powder, oils or lotions in your groin area until the site has completely healed. °• If you had a chest procedure, you may shower when you return home unless specifically instructed not to by °your discharging practitioner. °- DO NOT scrub incision; pat dry with a towel °- DO NOT apply any lotions, oils, powders to the incision °- No tub baths / swimming for at least six weeks. ° °SITE CARE °• You likely will have small openings in both groins °from catheters used during the procedure. If you °had a transfemoral procedure, one groin will have a °larger opening  and may be bruised or tender. °• If you had a chest procedure, you will have either a °small incision in your upper sternum (breast-bone) °or between your ribs on your left side. °- Chest wall site: The surgical incision should be °kept dry (no lotions / oils / powders) and open °to air. If you experience irritation from clothing °rubbing on the incision, a light gauze dressing °may be applied. °- Inspect your incision daily; notify your °physician if there is increased redness, swelling °or drainage from the incision. °- If the incision is located on your breast-bone °you must avoid lifting objects heavier than a °gallon of milk (eight pounds) and stretching / °twisting / pulling with your arms for at least °three months to ensure strong bone healing. ° ° °CONTACT [phone] °• Check your sites daily. Contact our office if you have °any of the following problems: °- Redness and warmth that does not go away °- Yellow or green drainage from the wound °- Fever and chills °- Increasing numbness in your legs °- Worsening pain at the site °• If you had a leg/groin procedure, it is normal to °have bruising or a soft lump at the site. It is not °normal if the lump suddenly becomes larger or °more firm. This may mean you are bleeding. If this °happens: °- Lie down °- Have someone press down hard, just above °the hole in your skin where the procedure was °performed for 15 minutes. If after holding on the °site, the lump does not become larger or harder, °they   are performing this correctly. °- If the bleeding has stopped after 15 minutes, rest °and stay laying down for at least two hours. °- If the bleeding continues, call 911 for an °ambulance. Do NOT drive yourself or have °someone else drive you. °

## 2015-04-17 NOTE — Progress Notes (Signed)
Pt very confused tonight. He does not know where he is or that he had surgery. RN reassured pt. Wife at bedside and concerned. Bed Alarm on. RN will continue to monitor.   Roselie Awkward, RN

## 2015-04-17 NOTE — Progress Notes (Addendum)
      [address]       [place] [address]             [phone]        2 Days Post-Op Procedure(s) (LRB): TRANSCATHETER AORTIC VALVE REPLACEMENT, TRANSFEMORAL (N/A) TRANSESOPHAGEAL ECHOCARDIOGRAM (TEE) (N/A)  Subjective: Patient without complaints this am. Has no pain.  Objective: Vital signs in last 24 hours: Temp:  [97.7 F (36.5 C)-98.8 F (37.1 C)] 98.6 F (37 C) (02/09 0415) Pulse Rate:  [58-68] 65 (02/09 0415) Cardiac Rhythm:  [-] Heart block;Bundle branch block (02/08 1916) Resp:  [13-18] 17 (02/09 0415) BP: (116-147)/(47-103) 118/50 mmHg (02/09 0415) SpO2:  [87 %-97 %] 95 % (02/09 0415) Arterial Line BP: (124-147)/(46-49) 144/46 mmHg (02/08 1000) Weight:  [161 lb 6 oz (73.2 kg)] 161 lb 6 oz (73.2 kg) (02/09 0415)   Current Weight  04/17/15 161 lb 6 oz (73.2 kg)    Hemodynamic parameters for last 24 hours: PAP: (44-50)/(16-20) 50/19 mmHg  Intake/Output from previous day: 02/08 0701 - 02/09 0700 In: -  Out: 800 [Urine:800]   Physical Exam:  Cardiovascular: RR Pulmonary: Clear to auscultation bilaterally; no rales, wheezes, or rhonchi. Abdomen: Soft, non tender, bowel sounds present. Extremities: No extremity edema and both feet are warm Wounds: Clean and dry.  No erythema or signs of infection.  Lab Results: CBC: Recent Labs  04/16/15 0528 04/17/15 0529  WBC 7.5 7.2  HGB 10.5* 10.7*  HCT 33.3* 31.7*  PLT 178 140*   BMET:  Recent Labs  04/16/15 0528 04/17/15 0529  NA 137 134*  K 4.3 4.0  CL 105 102  CO2 25 26  GLUCOSE 119* 139*  BUN 11 11  CREATININE 0.87 0.99  CALCIUM 8.0* 8.0*    PT/INR:  Lab Results  Component Value Date   INR 1.21 04/15/2015   INR 1.04 04/15/2015   INR 1.09 04/08/2015   ABG:  INR: Will add last result for INR, ABG once components are confirmed Will add last 4 CBG results once components are confirmed  Assessment/Plan:  1. CV - History of PAF. SR in the 60-70's. On Lisinopril 10  mg daily, ecasa 325 mg and Plavix 75 mg daily. 2.  Pulmonary - On 2 liters of oxygen via [place]. Wean to room air as tolerates.  3. Acute on chronic systolic heart failure-on Lasix 40 mg daily 4.  Acute blood loss anemia - H and H stable at 10.7 and 31.7 5. Mild thrombocytopenia-platelets 140,000 6. CBGs 120/111/132. Pre op HGA1C 6.1. He is likely pre diabetic. Will stop accu checks and SS PRN. 7. Possibly home in am  ZIMMERMAN,DONIELLE MPA-C 04/17/2015,7:40 AM   Chart reviewed, patient examined, agree with above. His only complaint to me is of some pain with urination and a slight blood tinge to urine. This is probably related to the foley. He does have BPH and had been on Flomax in the past which worked but it was stopped by his cardiologist in [facility] for some reason. I suspect he was concerned about dropping his BP but he could take it at night. His wife reported that he was up urinating every hour overnight. Will start Flomax tonight. Check UA.  His glucose is under control. He was on Metformin and Amaryl preop. Restart his oral meds at discharge.  H/O PAF. His Sotalol was not restarted yet due to slow heart rate. Will need to see what cardiology thinks.

## 2015-04-17 NOTE — Progress Notes (Signed)
CARDIAC REHAB PHASE I   PRE:  Rate/Rhythm: 71 SR  BP:  Supine:   Sitting: 145/54  Standing:    SaO2: 91%RA  MODE:  Ambulation: 600 ft   POST:  Rate/Rhythm: 80   BP:  Supine:   Sitting: 149/56  Standing:    SaO2: 92%RA 1117-1155 Assisted pt to bathroom and then walked 600 ft. Pt wanted to walk without walker but he was wobbly and a little impulsive. Pt walked 600 ft on RA with gait belt use and rolling walker and asst x 1 with steady gait. Tolerated well. To recliner after walk. Told NT that if wife leaves that pt would benefit from chair alarm as I do not think he will call for assistance. He has rollator and walker at home if needed. Would encourage him to use walker until stronger. Discussed CRP 2 and will refer to [facility] Phase 2.   Luetta Nutting, RN BSN  04/17/2015 11:52 AM

## 2015-04-18 LAB — TYPE AND SCREEN
ABO/RH(D): A POS
ANTIBODY SCREEN: NEGATIVE
UNIT DIVISION: 0
UNIT DIVISION: 0
Unit division: 0
Unit division: 0

## 2015-04-18 LAB — URINALYSIS, ROUTINE W REFLEX MICROSCOPIC
Bilirubin Urine: NEGATIVE
Glucose, UA: NEGATIVE mg/dL
Ketones, ur: NEGATIVE mg/dL
LEUKOCYTES UA: NEGATIVE
NITRITE: NEGATIVE
PH: 6.5 (ref 5.0–8.0)
Protein, ur: NEGATIVE mg/dL
SPECIFIC GRAVITY, URINE: 1.01 (ref 1.005–1.030)

## 2015-04-18 LAB — GLUCOSE, CAPILLARY: Glucose-Capillary: 210 mg/dL — ABNORMAL HIGH (ref 65–99)

## 2015-04-18 LAB — URINE MICROSCOPIC-ADD ON

## 2015-04-18 MED ORDER — SOTALOL HCL 80 MG PO TABS: 80.0000 mg | ORAL_TABLET | Freq: Two times a day (BID) | ORAL | Status: DC

## 2015-04-18 MED ORDER — CLOPIDOGREL BISULFATE 75 MG PO TABS
75.0000 mg | ORAL_TABLET | Freq: Every day | ORAL | Status: DC
Start: 2015-04-18 — End: 2015-05-19

## 2015-04-18 MED ORDER — ACETAMINOPHEN 325 MG PO TABS
650.0000 mg | ORAL_TABLET | Freq: Four times a day (QID) | ORAL | Status: AC | PRN
Start: 2015-04-18 — End: ?

## 2015-04-18 MED ORDER — ASPIRIN 81 MG PO CHEW: 81.0000 mg | CHEWABLE_TABLET | Freq: Every day | ORAL | Status: DC

## 2015-04-18 MED ORDER — TAMSULOSIN HCL 0.4 MG PO CAPS: 0.4000 mg | ORAL_CAPSULE | Freq: Every day | ORAL | Status: AC

## 2015-04-18 MED ORDER — LISINOPRIL 10 MG PO TABS
10.0000 mg | ORAL_TABLET | Freq: Every day | ORAL | Status: DC
Start: 2015-04-18 — End: 2015-05-19

## 2015-04-18 MED ORDER — INSULIN ASPART 100 UNIT/ML ~~LOC~~ SOLN
0.0000 [IU] | Freq: Three times a day (TID) | SUBCUTANEOUS | Status: DC
  Administered 2015-04-18: 3 [IU] via SUBCUTANEOUS

## 2015-04-18 MED ORDER — FUROSEMIDE 20 MG PO TABS: 20.0000 mg | ORAL_TABLET | Freq: Every day | ORAL | Status: DC

## 2015-04-18 NOTE — Care Management Note (Signed)
Case Management Note Previous CM note initiated by Tomi Bamberger RN, CM  Patient Details  Name: [place] MRN: [id number] Date of Birth: [birth year]  Subjective/Objective:   Pt admitted for Chest Pain- transfer pt from [facility]. Pt is from home with wife. Per pt he had a recent fall on Dec 15th and was supposed to have [facility] and the agency never showed. Pt is now scheduled to have outpatient therapy at the [facility] on the 7th of Feb. Wife to provide transportation. Pt has DME RW.                  Action/Plan: No further needs from CM at this time.    Expected Discharge Date:     04/18/15             Expected Discharge Plan:  Home/Self Care  In-House Referral:     Discharge planning Services  CM Consult  Post Acute Care Choice:    Choice offered to:     DME Arranged:    DME Agency:     HH Arranged:    HH Agency:     Status of Service:  Completed, signed off  Medicare Important Message Given:  Yes Date Medicare IM Given:    Medicare IM give by:    Date Additional Medicare IM Given:    Additional Medicare Important Message give by:     If discussed at Long Length of Stay Meetings, dates discussed:    Discharge Disposition: Home/self care   Additional Comments:   04/18/15- 1600- Donn Pierini RN, BSN- pt for d/c home today s/p TAVR-  No further cm needs noted.   1049 04-12-15 Tomi Bamberger, RN,BSN [phone] CM did speak with pt in regards to disposition needs. Plan was for home with Outpatient PT Services and PCP was to set up. CM did touch base with wife- plan will be for TAVR on Tuesday and CM stated we will f/u post op for home needs. No further needs from CM at this time.    Darrold Span, RN 04/18/2015, 3:57 PM

## 2015-04-18 NOTE — Progress Notes (Addendum)
      [address]       [place] [address]             [phone]        3 Days Post-Op Procedure(s) (LRB): TRANSCATHETER AORTIC VALVE REPLACEMENT, TRANSFEMORAL (N/A) TRANSESOPHAGEAL ECHOCARDIOGRAM (TEE) (N/A)  Subjective: Patient sleeping this am. He had a bad night. He had confution and "belly pain". Had a bowel movement and no longer has belly pain.  Objective: Vital signs in last 24 hours: Temp:  [97.8 F (36.6 C)-98.6 F (37 C)] 97.8 F (36.6 C) (02/10 0528) Pulse Rate:  [58-76] 58 (02/10 0528) Cardiac Rhythm:  [-] Heart block;Bundle branch block (02/09 1900) Resp:  [17-18] 18 (02/10 0528) BP: (109-145)/(44-62) 109/48 mmHg (02/10 0528) SpO2:  [91 %-95 %] 91 % (02/10 0528) Weight:  [159 lb 1.6 oz (72.167 kg)] 159 lb 1.6 oz (72.167 kg) (02/10 0219)   Current Weight  04/18/15 159 lb 1.6 oz (72.167 kg)       Intake/Output from previous day: 02/09 0701 - 02/10 0700 In: 13244 [P.O.:24530] Out: 650 [Urine:650]   Physical Exam:  Cardiovascular: SB Pulmonary: Clear to auscultation bilaterally; no rales, wheezes, or rhonchi. Abdomen: Soft, non tender, bowel sounds present. Extremities: No extremity edema and both feet are warm Wounds: Clean and dry.  No erythema or signs of infection.  Lab Results: CBC:  Recent Labs  04/16/15 0528 04/17/15 0529  WBC 7.5 7.2  HGB 10.5* 10.7*  HCT 33.3* 31.7*  PLT 178 140*   BMET:   Recent Labs  04/16/15 0528 04/17/15 0529  NA 137 134*  K 4.3 4.0  CL 105 102  CO2 25 26  GLUCOSE 119* 139*  BUN 11 11  CREATININE 0.87 0.99  CALCIUM 8.0* 8.0*    PT/INR:  Lab Results  Component Value Date   INR 1.21 04/15/2015   INR 1.04 04/15/2015   INR 1.09 04/08/2015   ABG:  INR: Will add last result for INR, ABG once components are confirmed Will add last 4 CBG results once components are confirmed  Assessment/Plan:  1. CV - History of PAF. SB in the 50's this am. On Lisinopril 10 mg daily, Sotalol  80 mg bid,ecasa 325 mg and Plavix 75 mg daily. Will defer to cardiology, but now bradycardic with Sotalol. 2.  Pulmonary - Was on 2 liters of oxygen via [place]. On room air. Encourage incentive spirometer 3. Acute on chronic systolic heart failure-on Lasix 40 mg daily 4.  Acute blood loss anemia - H and H stable at 10.7 and 31.7 5. Mild thrombocytopenia-platelets 140,000 6. CBGs 120/111/132. Pre op HGA1C 6.1. Will restart oral medications as taken pre op at discharge. 7. BPH-Flomax started yesterday and he can take it at night UA showed rare bacteria. 8. Regarding confusion, stopped Ultram. Tylenol PRN pain. Not on any Oxy. Monitor  ZIMMERMAN,DONIELLE MPA-C 04/18/2015,7:52 AM     Chart reviewed, patient examined, agree with above. Feels better this afternoon and wants to go home. He will follow up with his PCP next week and return to see Dr. Excell Seltzer for postop TAVR follow up.

## 2015-04-18 NOTE — Progress Notes (Signed)
CARDIAC REHAB PHASE I   PRE:  Rate/Rhythm: 57 SB  BP:  Supine: 122/55  Sitting:   Standing:    SaO2: 94%RA  MODE:  Ambulation: 890 ft   POST:  Rate/Rhythm: 69 SR  BP:  Supine:   Sitting: 114/53  Standing:    SaO2: 95%RA 1103-1147 Pt walked 890 ft on RA with rolling walker and minimal asst. Tolerated well. Encouraged pt to use walker at home and minimal asst until steadier. Gave modified ex walking instructions and encouraged low sodium. Referring to [facility] Phase 2.   Luetta Nutting, RN BSN  04/18/2015 11:43 AM

## 2015-04-18 NOTE — Progress Notes (Addendum)
Patient Name: Gary Beck Date of Encounter: 04/18/2015   SUBJECTIVE Had bed night yesterday. Feeling welll since had BM this morning. Awake and talking. Wants to go home. Ambulating well.   CURRENT MEDS . aspirin EC  325 mg Oral Daily  . atorvastatin  40 mg Oral q1800  . clopidogrel  75 mg Oral Q breakfast  . docusate sodium  200 mg Oral Daily  . DULoxetine  60 mg Oral BID  . famotidine  20 mg Oral BID  . furosemide  40 mg Oral Daily  . insulin aspart  0-9 Units Subcutaneous TID WC  . lisinopril  10 mg Oral Daily  . potassium chloride  20 mEq Oral Daily  . sodium chloride flush  3 mL Intravenous Q12H  . sotalol  80 mg Oral Q12H  . tamsulosin  0.4 mg Oral QPC supper    OBJECTIVE  Filed Vitals:   04/17/15 1514 04/17/15 2116 04/18/15 0219 04/18/15 0528  BP:  114/44  109/48  Pulse:  63  58  Temp:  98.6 F (37 C)  97.8 F (36.6 C)  TempSrc:  Oral  Oral  Resp:  18  18  Height:      Weight:   159 lb 1.6 oz (72.167 kg)   SpO2: 95% 91%  91%    Intake/Output Summary (Last 24 hours) at 04/18/15 1000 Last data filed at 04/17/15 2200  Gross per 24 hour  Intake  16109 ml  Output    400 ml  Net  23890 ml   Filed Weights   04/16/15 0500 04/17/15 0415 04/18/15 0219  Weight: 159 lb 6.3 oz (72.3 kg) 161 lb 6 oz (73.2 kg) 159 lb 1.6 oz (72.167 kg)    PHYSICAL EXAM  General: Pleasant, NAD. Neuro: Alert and oriented X 3. Moves all extremities spontaneously. Psych: Normal affect. HEENT:  Normal  Neck: Supple without bruits or JVD. Lungs:  Resp regular and unlabored, CTA. Heart: RRR no s3, s4. SEM Abdomen: Soft, non-tender, non-distended, BS + x 4.  Extremities: No clubbing, cyanosis or edema. DP/PT/Radials 2+ and equal bilaterally.  Accessory Clinical Findings  CBC  Recent Labs  04/16/15 0528 04/17/15 0529  WBC 7.5 7.2  HGB 10.5* 10.7*  HCT 33.3* 31.7*  MCV 92.2 91.6  PLT 178 140*   Basic Metabolic Panel  Recent Labs  04/15/15 2300  04/16/15 0528  04/17/15 0529  NA  --   < > 137 134*  K  --   < > 4.3 4.0  CL  --   < > 105 102  CO2  --   --  25 26  GLUCOSE  --   < > 119* 139*  BUN  --   < > 11 11  CREATININE 0.89  < > 0.87 0.99  CALCIUM  --   --  8.0* 8.0*  MG 2.9*  --  2.3  --   < > = values in this interval not displayed. Liver Function Tests  Recent Labs  04/15/15 1040  AST 45*  ALT 49  ALKPHOS 140*  BILITOT 1.1  PROT 5.5*  ALBUMIN 3.0*    TELE  NSR with PACs  Radiology/Studies  Dg Chest 2 View  04/15/2015  CLINICAL DATA:  Preop study for transcatheter aortic valve replacement. No current chest complaints. History of aortic stenosis and hypertension. EXAM: CHEST  2 VIEW COMPARISON:  CT, 04/11/2015 FINDINGS: Changes from CABG surgery are stable. Cardiac silhouette is mildly enlarged. No mediastinal or hilar masses  or evidence of adenopathy. Lungs are hyperexpanded. There are small bilateral pleural effusions mild associated lung base atelectasis. No overt pulmonary edema. No evidence of pneumonia. No pneumothorax. Bony thorax is demineralized but grossly intact. IMPRESSION: 1. Findings similar to the prior chest CT. 2. Small bilateral pleural effusions with associated dependent lower lobe atelectasis. 3. No pulmonary edema or evidence of pneumonia. Stable changes from previous CABG surgery. Electronically Signed   By: Amie Portland M.D.   On: 04/15/2015 11:33   Ct Coronary Morp W/cta Cor W/score W/ca W/cm &/or Wo/cm  04/11/2015  ADDENDUM REPORT: 04/11/2015 17:26 CLINICAL DATA:  Aortic stenosis EXAM: Cardiac TAVR CT TECHNIQUE: The patient was scanned on a Philips 256 scanner. A 120 kV retrospective scan was triggered in the descending thoracic aorta at 111 HU's. Gantry rotation speed was 270 msecs and collimation was .9 mm. No beta blockade or nitro were given. The 3D data set was reconstructed in 5% intervals of the R-R cycle. Systolic and diastolic phases were analyzed on a dedicated work station using MPR, MIP and VRT  modes. The patient received 80 cc of contrast. FINDINGS: Aortic Valve: Tri-leaflet and moderately calcified Mild MAC. No LAA thrombus Aorta: Mild root dilatation. Nearly circumferential calcification of the STJ. Moderate calcification of the arch with no atheroma and normal origin of the great vessels Sinotubular Junction:  30 mm Ascending Thoracic Aorta:  37 mm Aortic Arch:  32 mm Descending Thoracic Aorta:  28 mm Sinus of Valsalva Measurements: Non-coronary:  33 mm Right -coronary:  31 mm Left -coronary:  34 mm Coronary Artery Height above Annulus: Left Main:  14 mm Right Coronary:  16 mm Virtual Basal Annulus Measurements: Maximum/Minimum Diameter:  29.8 x 22.5 mm Perimeter:  82 mm Area:  509 mm2 Coronary Arteries:  Sufficient height above annulus for deployment Optimum Fluoroscopic Angle for Delivery: RAO 2 degrees Cranial 0 degrees IMPRESSION: 1) Moderately calcified tri-leaflet aortic valve with annulus 509 mm2 suitable for a 26 mm Sapien 3 valve 2) Sufficient coronary height for delivery 3) Near circumferential calcification of the STJ 4) Optimum angiographic angle for delivery RAO 2 degrees Cranial 0 degrees 5) No LAA thrombus 6) Moderate calcification of the aortic arch Charlton Haws Electronically Signed   By: Charlton Haws M.D.   On: 04/11/2015 17:26  04/11/2015  EXAM: OVER-READ INTERPRETATION  CT CHEST The following report is an over-read performed by radiologist Dr. Royal Piedra [facility] on 04/11/2015. This over-read does not include interpretation of cardiac or coronary anatomy or pathology. The coronary calcium score/coronary CTA interpretation by the cardiologist is attached. COMPARISON:  No priors. FINDINGS: Extracardiac findings will be dictated under separate accession number for contemporaneously obtained CTA of the chest, abdomen and pelvis obtained on 04/11/2015. IMPRESSION: See separate dictation for extracardiac findings condyle reported on contemporaneously obtained CTA of  the chest, abdomen and pelvis dated 04/11/2015. Electronically Signed: By: Trudie Reed M.D. On: 04/11/2015 14:45   Dg Chest Port 1 View  04/16/2015  CLINICAL DATA:  Aortic valve replacement. EXAM: PORTABLE CHEST 1 VIEW COMPARISON:  04/15/2015 . FINDINGS: Swan-Ganz catheter noted in stable position. Prior CABG and aortic valve replacement. Cardiomegaly with normal pulmonary vascularity. Interim partial clearing of bilateral pulmonary edema with mild residual in the lung bases. Atelectatic changes in both lung bases. Small bilateral pleural effusions cannot be excluded. No pneumothorax. IMPRESSION: 1. Swan-Ganz catheter stable position. 2. Prior CABG and aortic valve replacement. 3. Cardiomegaly with interim partial clearing of bilateral pulmonary edema. Mild residual  edema in the lung bases. Tent bibasilar atelectasis . Electronically Signed   By: Maisie Fus  Register   On: 04/16/2015 07:26   Dg Chest Port 1 View  04/15/2015  CLINICAL DATA:  Status post transcatheter aortic valve replacement today. Initial encounter. EXAM: PORTABLE CHEST 1 VIEW COMPARISON:  PA and lateral chest earlier today. FINDINGS: The patient has a new Swan-Ganz catheter in place from right IJ approach. The tip of the catheter is in the proximal right main pulmonary artery. There is no pneumothorax. Bilateral pleural effusions and basilar atelectasis are seen. There is cardiomegaly without edema. IMPRESSION: Swan-Ganz catheter tip is in the proximal right main pulmonary artery. Negative for pneumothorax. Small bilateral pleural effusions. Electronically Signed   By: Drusilla Kanner M.D.   On: 04/15/2015 17:37   Ct Angio Chest Aorta W/cm &/or Wo/cm  04/11/2015  CLINICAL DATA:  82 year old male with history of severe aortic stenosis. Preprocedural study prior to potential transcatheter aortic valve replacement (TAVR). EXAM: CT ANGIOGRAPHY CHEST, ABDOMEN AND PELVIS TECHNIQUE: Multidetector CT imaging through the chest, abdomen and pelvis  was performed using the standard protocol during bolus administration of intravenous contrast. Multiplanar reconstructed images and MIPs were obtained and reviewed to evaluate the vascular anatomy. CONTRAST:  75mL OMNIPAQUE IOHEXOL 350 MG/ML SOLN, 75mL OMNIPAQUE IOHEXOL 350 MG/ML SOLN COMPARISON:  No priors. FINDINGS: CTA CHEST FINDINGS Mediastinum/Lymph Nodes: Heart size is enlarged with left ventricular and left atrial dilatation. There is no significant pericardial fluid, thickening or pericardial calcification. There is atherosclerosis of the thoracic aorta, the great vessels of the mediastinum and the coronary arteries, including calcified atherosclerotic plaque in the left main, left anterior descending, left circumflex and right coronary arteries. Status post median sternotomy for CABG, including LIMA to the LAD. There is also a saphenous vein graft to the obtuse marginal distribution. Severe thickening calcification of the aortic valve. Calcification of the mitral annulus. No pathologically enlarged mediastinal or hilar lymph nodes. Esophagus is unremarkable in appearance. No axillary lymphadenopathy. Lungs/Pleura: Moderate bilateral pleural effusions are simple in appearance layering dependently. These are associated with areas of passive atelectasis in the lower lobes of the lungs bilaterally. No acute consolidative airspace disease. There are some patchy areas of ground-glass attenuation and interlobular septal thickening, favored to reflect a background of very mild interstitial pulmonary edema. No suspicious appearing pulmonary nodules or masses are noted in the well aerated portions of the lungs. Musculoskeletal/Soft Tissues: Median sternotomy wires. Healing nondisplaced fractures of the right fourth, fifth and sixth ribs anteriorly, as well as the right seventh rib laterally. There are no aggressive appearing lytic or blastic lesions noted in the visualized portions of the skeleton. CTA ABDOMEN AND  PELVIS FINDINGS Hepatobiliary: The liver has a slightly shrunken appearance and nodular contour, suggestive of underlying cirrhosis. In the central aspect of the right lobe of the liver between segments 7 and 8 (image 133 of series 401) there is a somewhat ill-defined hypervascular area estimated to measure 1.9 x 2.3 cm. Immediately adjacent to this extending superiorly from this area there is a branching hypodensity, which appears likely to be within the distribution of the right hepatic vein. Findings are concerning for potential small hepatocellular carcinoma with associated tumor thrombus in the right hepatic vein. There is an additional ill-defined hypervascular area in the inferior aspect of segment 5 of the liver measuring 1.7 x 2.8 cm (image 183 of series 401). No intra or extrahepatic biliary ductal dilatation. Numerous calcified gallstones lie dependently in the gallbladder. No current findings  to suggest an acute cholecystitis at this time. Pancreas: No pancreatic mass. No pancreatic ductal dilatation. No pancreatic or peripancreatic fluid or inflammatory changes. Spleen: Unremarkable. Adrenals/Urinary Tract: Bilateral adrenal glands are normal in appearance. Tiny simple cysts in the upper pole of the left kidney. Sub cm low-attenuation lesion in the upper pole the right kidney is too small to definitively characterize, but is also favored to represent a small cyst. No hydroureteronephrosis or perinephric stranding to indicate urinary tract obstruction at this time. Urinary bladder is normal in appearance. Stomach/Bowel: Normal appearance of the stomach. No pathologic dilatation of small bowel or colon. The appendix is not confidently identified may be surgically absent. Regardless, there are no inflammatory changes noted adjacent to the cecum to suggest presence of an acute appendicitis at this time. Vascular/Lymphatic: Extensive atherosclerosis throughout the abdominal and pelvic vasculature, with  vascular findings and measurements pertinent to potential TAVR procedure, as detailed below. No aneurysm or dissection identified in the abdominal or pelvic vasculature. No lymphadenopathy noted in the abdomen or pelvis. Reproductive: Prostate gland and seminal vesicles are unremarkable in appearance. Other: No significant volume of ascites.  No pneumoperitoneum. Musculoskeletal: There are no aggressive appearing lytic or blastic lesions noted in the visualized portions of the skeleton. VASCULAR MEASUREMENTS PERTINENT TO TAVR: AORTA: Minimal Aortic Diameter -  12.7 x 13.8 mm Severity of Aortic Calcification -  moderate to severe RIGHT PELVIS: Right Common Iliac Artery - Minimal Diameter - 9.1 x 7.8 mm Tortuosity - mild Calcification - moderate to severe Right External Iliac Artery - Minimal Diameter - 8.6 x 8.3 mm Tortuosity - severe Calcification - none Right Common Femoral Artery - Minimal Diameter - 7.9 x 6.0 mm Tortuosity - mild Calcification - mild LEFT PELVIS: Left Common Iliac Artery - Minimal Diameter - 8.0 x 8.8 mm Tortuosity - mild Calcification - moderate to severe Left External Iliac Artery - Minimal Diameter - 8.4 x 8.1 mm Tortuosity - mild Calcification - none Left Common Femoral Artery - Minimal Diameter - 7.8 x 6.5 mm Tortuosity - mild Calcification - mild Review of the MIP images confirms the above findings. IMPRESSION: 1. Vascular findings and measurements pertinent to potential TAVR procedure, as detailed above. This patient does appear to have suitable pelvic arterial access bilaterally, however, left-sided vascular approach is considered far more optimal given the less severe tortuosity of the left external iliac artery. 2. Morphologic changes in the liver strongly suggestive of cirrhosis, with suspicious hypervascular area in the right lobe of the liver which is concerning for potential hepatocellular carcinoma. This appears to be associated with potential tumor thrombus in branches of the  right hepatic vein, as detailed above. There is an additional small hypervascular area in segment 5 of the liver as well. Further evaluation of these findings with MRI of the abdomen with and without IV gadolinium (preferably Eovist) is strongly recommended in the near future. 3. The appearance of the chest suggests mild congestive heart failure, including mild interstitial pulmonary edema and moderate bilateral pleural effusions. 4. Thickening calcification of the aortic valve, compatible with the reported clinical history of severe aortic stenosis. 5. Cardiomegaly with left ventricular and left atrial dilatation. 6. Cholelithiasis without evidence of acute cholecystitis at this time. 7. Multiple healing nondisplaced right-sided rib fractures, as above. 8. Additional incidental findings, as above. These results were called by telephone at the time of interpretation on 04/11/2015 at 4:10 pm to Dr. Tonny Bollman, who verbally acknowledged these results. Electronically Signed   By: Reuel Boom  Entrikin M.D.   On: 04/11/2015 16:15   Ct Angio Abd/pel W/ And/or W/o  04/11/2015  CLINICAL DATA:  82 year old male with history of severe aortic stenosis. Preprocedural study prior to potential transcatheter aortic valve replacement (TAVR). EXAM: CT ANGIOGRAPHY CHEST, ABDOMEN AND PELVIS TECHNIQUE: Multidetector CT imaging through the chest, abdomen and pelvis was performed using the standard protocol during bolus administration of intravenous contrast. Multiplanar reconstructed images and MIPs were obtained and reviewed to evaluate the vascular anatomy. CONTRAST:  75mL OMNIPAQUE IOHEXOL 350 MG/ML SOLN, 75mL OMNIPAQUE IOHEXOL 350 MG/ML SOLN COMPARISON:  No priors. FINDINGS: CTA CHEST FINDINGS Mediastinum/Lymph Nodes: Heart size is enlarged with left ventricular and left atrial dilatation. There is no significant pericardial fluid, thickening or pericardial calcification. There is atherosclerosis of the thoracic aorta, the great  vessels of the mediastinum and the coronary arteries, including calcified atherosclerotic plaque in the left main, left anterior descending, left circumflex and right coronary arteries. Status post median sternotomy for CABG, including LIMA to the LAD. There is also a saphenous vein graft to the obtuse marginal distribution. Severe thickening calcification of the aortic valve. Calcification of the mitral annulus. No pathologically enlarged mediastinal or hilar lymph nodes. Esophagus is unremarkable in appearance. No axillary lymphadenopathy. Lungs/Pleura: Moderate bilateral pleural effusions are simple in appearance layering dependently. These are associated with areas of passive atelectasis in the lower lobes of the lungs bilaterally. No acute consolidative airspace disease. There are some patchy areas of ground-glass attenuation and interlobular septal thickening, favored to reflect a background of very mild interstitial pulmonary edema. No suspicious appearing pulmonary nodules or masses are noted in the well aerated portions of the lungs. Musculoskeletal/Soft Tissues: Median sternotomy wires. Healing nondisplaced fractures of the right fourth, fifth and sixth ribs anteriorly, as well as the right seventh rib laterally. There are no aggressive appearing lytic or blastic lesions noted in the visualized portions of the skeleton. CTA ABDOMEN AND PELVIS FINDINGS Hepatobiliary: The liver has a slightly shrunken appearance and nodular contour, suggestive of underlying cirrhosis. In the central aspect of the right lobe of the liver between segments 7 and 8 (image 133 of series 401) there is a somewhat ill-defined hypervascular area estimated to measure 1.9 x 2.3 cm. Immediately adjacent to this extending superiorly from this area there is a branching hypodensity, which appears likely to be within the distribution of the right hepatic vein. Findings are concerning for potential small hepatocellular carcinoma with  associated tumor thrombus in the right hepatic vein. There is an additional ill-defined hypervascular area in the inferior aspect of segment 5 of the liver measuring 1.7 x 2.8 cm (image 183 of series 401). No intra or extrahepatic biliary ductal dilatation. Numerous calcified gallstones lie dependently in the gallbladder. No current findings to suggest an acute cholecystitis at this time. Pancreas: No pancreatic mass. No pancreatic ductal dilatation. No pancreatic or peripancreatic fluid or inflammatory changes. Spleen: Unremarkable. Adrenals/Urinary Tract: Bilateral adrenal glands are normal in appearance. Tiny simple cysts in the upper pole of the left kidney. Sub cm low-attenuation lesion in the upper pole the right kidney is too small to definitively characterize, but is also favored to represent a small cyst. No hydroureteronephrosis or perinephric stranding to indicate urinary tract obstruction at this time. Urinary bladder is normal in appearance. Stomach/Bowel: Normal appearance of the stomach. No pathologic dilatation of small bowel or colon. The appendix is not confidently identified may be surgically absent. Regardless, there are no inflammatory changes noted adjacent to the cecum to suggest  presence of an acute appendicitis at this time. Vascular/Lymphatic: Extensive atherosclerosis throughout the abdominal and pelvic vasculature, with vascular findings and measurements pertinent to potential TAVR procedure, as detailed below. No aneurysm or dissection identified in the abdominal or pelvic vasculature. No lymphadenopathy noted in the abdomen or pelvis. Reproductive: Prostate gland and seminal vesicles are unremarkable in appearance. Other: No significant volume of ascites.  No pneumoperitoneum. Musculoskeletal: There are no aggressive appearing lytic or blastic lesions noted in the visualized portions of the skeleton. VASCULAR MEASUREMENTS PERTINENT TO TAVR: AORTA: Minimal Aortic Diameter -  12.7 x 13.8  mm Severity of Aortic Calcification -  moderate to severe RIGHT PELVIS: Right Common Iliac Artery - Minimal Diameter - 9.1 x 7.8 mm Tortuosity - mild Calcification - moderate to severe Right External Iliac Artery - Minimal Diameter - 8.6 x 8.3 mm Tortuosity - severe Calcification - none Right Common Femoral Artery - Minimal Diameter - 7.9 x 6.0 mm Tortuosity - mild Calcification - mild LEFT PELVIS: Left Common Iliac Artery - Minimal Diameter - 8.0 x 8.8 mm Tortuosity - mild Calcification - moderate to severe Left External Iliac Artery - Minimal Diameter - 8.4 x 8.1 mm Tortuosity - mild Calcification - none Left Common Femoral Artery - Minimal Diameter - 7.8 x 6.5 mm Tortuosity - mild Calcification - mild Review of the MIP images confirms the above findings. IMPRESSION: 1. Vascular findings and measurements pertinent to potential TAVR procedure, as detailed above. This patient does appear to have suitable pelvic arterial access bilaterally, however, left-sided vascular approach is considered far more optimal given the less severe tortuosity of the left external iliac artery. 2. Morphologic changes in the liver strongly suggestive of cirrhosis, with suspicious hypervascular area in the right lobe of the liver which is concerning for potential hepatocellular carcinoma. This appears to be associated with potential tumor thrombus in branches of the right hepatic vein, as detailed above. There is an additional small hypervascular area in segment 5 of the liver as well. Further evaluation of these findings with MRI of the abdomen with and without IV gadolinium (preferably Eovist) is strongly recommended in the near future. 3. The appearance of the chest suggests mild congestive heart failure, including mild interstitial pulmonary edema and moderate bilateral pleural effusions. 4. Thickening calcification of the aortic valve, compatible with the reported clinical history of severe aortic stenosis. 5. Cardiomegaly with left  ventricular and left atrial dilatation. 6. Cholelithiasis without evidence of acute cholecystitis at this time. 7. Multiple healing nondisplaced right-sided rib fractures, as above. 8. Additional incidental findings, as above. These results were called by telephone at the time of interpretation on 04/11/2015 at 4:10 pm to Dr. Tonny Bollman, who verbally acknowledged these results. Electronically Signed   By: Trudie Reed M.D.   On: 04/11/2015 16:15    ASSESSMENT AND PLAN 1. Severe low gradient AS  - S/P TAVR (transcatheter aortic valve replacement). Good valve function looks good  2. CAD s/p CABG. No chest pain  3. Acute on chronic systolic heart failure - Euvolemic. Continue lasix.   4. PAF - Maintaining sinus rhythm. Continue sotalol. Continue ASA and Plavix. Eliquis stopped prior to cath 02/2015 and fall, resume per primary cardiologist     Signed, Manson Passey PA-C Pager [phone]  Patient seen, examined. Available data reviewed. Agree with findings, assessment, and plan as outlined by Chelsea Aus, PA-C. Confusion overnight noted. Feels much better this morning. Exam reveals alert, oriented male in NAD, lungs CTA, heart RRR, extremities without edema. Post-op  echo shows normal Transcatheter valve function. He is doing well and I think is likely ready for DC home today. We discussed medication changes at discharge. Plavix 75 mg daily x 6 months is a new drug for him. Will keep on sotalol 80 mg BID. Check EKG this am since sotalol re-initiated. He is tolerating lisinopril at low dose. Will add lasix 20 mg daily. I left a message with his cardiologist in [place] to call back (Dr [name]) and tried to call Dr Mikey Bussing (PCP) but couldn't get through to his office.   Tonny Bollman, M.D. 04/18/2015 10:14 AM

## 2015-04-18 NOTE — Care Management Important Message (Signed)
Important Message  Patient Details  Name: Gary Beck MRN: [id number] Date of Birth: [birth year]   Medicare Important Message Given:  Yes    [name] 04/18/2015, 11:09 AM

## 2015-04-18 NOTE — Progress Notes (Signed)
Pt. Discharged to home with wife Pt. D/C'd via wheelchair with NT Discharge information reviewed and given All personal belongings given to Pt.  Education discussed and reviewed with teach-back from patient/family IV was d/c and intact upon removal Tele d/c

## 2015-04-18 NOTE — Anesthesia Postprocedure Evaluation (Signed)
Anesthesia Post Note  Patient: Gary Beck  Procedure(s) Performed: Procedure(s) (LRB): TRANSCATHETER AORTIC VALVE REPLACEMENT, TRANSFEMORAL (N/A) TRANSESOPHAGEAL ECHOCARDIOGRAM (TEE) (N/A)  Patient location during evaluation: ICU Anesthesia Type: General Level of consciousness: awake and alert and patient cooperative Pain management: pain level controlled Vital Signs Assessment: post-procedure vital signs reviewed and stable Respiratory status: spontaneous breathing and respiratory function stable Cardiovascular status: stable Anesthetic complications: no    Last Vitals:  Filed Vitals:   04/17/15 2116 04/18/15 0528  BP: 114/44 109/48  Pulse: 63 58  Temp: 37 C 36.6 C  Resp: 18 18    Last Pain:  Filed Vitals:   04/18/15 0529  PainSc: 0-No pain                 Alverta Caccamo S

## 2015-04-21 MED FILL — Magnesium Sulfate Inj 50%: INTRAMUSCULAR | Qty: 10 | Status: AC

## 2015-04-21 MED FILL — Heparin Sodium (Porcine) Inj 1000 Unit/ML: INTRAMUSCULAR | Qty: 30 | Status: AC

## 2015-04-21 MED FILL — Dextrose Inj 5%: INTRAVENOUS | Qty: 250 | Status: AC

## 2015-04-21 MED FILL — Phenylephrine HCl Inj 10 MG/ML: INTRAMUSCULAR | Qty: 2 | Status: AC

## 2015-04-21 MED FILL — Potassium Chloride Inj 2 mEq/ML: INTRAVENOUS | Qty: 40 | Status: AC

## 2015-04-26 ENCOUNTER — Emergency Department (HOSPITAL_COMMUNITY)
Admission: EM | Admit: 2015-04-26 | Discharge: 2015-04-27 | Disposition: A | Discharge status: Home / Self Care | Payer: Medicare (Managed Care) | Attending: Emergency Medicine | Admitting: Emergency Medicine

## 2015-04-26 ENCOUNTER — Encounter (HOSPITAL_COMMUNITY): Payer: Self-pay

## 2015-04-26 DIAGNOSIS — M1990 Unspecified osteoarthritis, unspecified site: Secondary | ICD-10-CM | POA: Diagnosis not present

## 2015-04-26 DIAGNOSIS — N400 Enlarged prostate without lower urinary tract symptoms: Secondary | ICD-10-CM | POA: Diagnosis not present

## 2015-04-26 DIAGNOSIS — Z7982 Long term (current) use of aspirin: Secondary | ICD-10-CM | POA: Insufficient documentation

## 2015-04-26 DIAGNOSIS — K219 Gastro-esophageal reflux disease without esophagitis: Secondary | ICD-10-CM | POA: Diagnosis not present

## 2015-04-26 DIAGNOSIS — Z7984 Long term (current) use of oral hypoglycemic drugs: Secondary | ICD-10-CM | POA: Insufficient documentation

## 2015-04-26 DIAGNOSIS — Z880 Allergy status to penicillin: Secondary | ICD-10-CM | POA: Diagnosis not present

## 2015-04-26 DIAGNOSIS — Z862 Personal history of diseases of the blood and blood-forming organs and certain disorders involving the immune mechanism: Secondary | ICD-10-CM | POA: Diagnosis not present

## 2015-04-26 DIAGNOSIS — Z951 Presence of aortocoronary bypass graft: Secondary | ICD-10-CM | POA: Insufficient documentation

## 2015-04-26 DIAGNOSIS — F329 Major depressive disorder, single episode, unspecified: Secondary | ICD-10-CM | POA: Insufficient documentation

## 2015-04-26 DIAGNOSIS — R040 Epistaxis: Secondary | ICD-10-CM

## 2015-04-26 DIAGNOSIS — I5022 Chronic systolic (congestive) heart failure: Secondary | ICD-10-CM | POA: Insufficient documentation

## 2015-04-26 DIAGNOSIS — Z79899 Other long term (current) drug therapy: Secondary | ICD-10-CM | POA: Insufficient documentation

## 2015-04-26 DIAGNOSIS — I2510 Atherosclerotic heart disease of native coronary artery without angina pectoris: Secondary | ICD-10-CM | POA: Insufficient documentation

## 2015-04-26 DIAGNOSIS — E1121 Type 2 diabetes mellitus with diabetic nephropathy: Secondary | ICD-10-CM | POA: Insufficient documentation

## 2015-04-26 DIAGNOSIS — Z7902 Long term (current) use of antithrombotics/antiplatelets: Secondary | ICD-10-CM | POA: Diagnosis not present

## 2015-04-26 DIAGNOSIS — Z87891 Personal history of nicotine dependence: Secondary | ICD-10-CM | POA: Insufficient documentation

## 2015-04-26 DIAGNOSIS — I10 Essential (primary) hypertension: Secondary | ICD-10-CM | POA: Diagnosis not present

## 2015-04-26 DIAGNOSIS — I4891 Unspecified atrial fibrillation: Secondary | ICD-10-CM | POA: Insufficient documentation

## 2015-04-26 LAB — CBC
HCT: 37.7 % — ABNORMAL LOW (ref 39.0–52.0)
HEMOGLOBIN: 12.1 g/dL — AB (ref 13.0–17.0)
MCH: 29.1 pg (ref 26.0–34.0)
MCHC: 32.1 g/dL (ref 30.0–36.0)
MCV: 90.6 fL (ref 78.0–100.0)
Platelets: 311 10*3/uL (ref 150–400)
RBC: 4.16 MIL/uL — AB (ref 4.22–5.81)
RDW: 13.9 % (ref 11.5–15.5)
WBC: 6.5 10*3/uL (ref 4.0–10.5)

## 2015-04-26 LAB — PROTIME-INR
INR: 1.05 (ref 0.00–1.49)
Prothrombin Time: 13.9 seconds (ref 11.6–15.2)

## 2015-04-26 MED ORDER — TRANEXAMIC ACID 1000 MG/10ML IV SOLN
500.0000 mg | Freq: Once | INTRAVENOUS | Status: AC
  Administered 2015-04-27: 500 mg via TOPICAL
  Filled 2015-04-26: qty 10

## 2015-04-26 MED ORDER — OXYMETAZOLINE HCL 0.05 % NA SOLN
1.0000 | Freq: Once | NASAL | Status: AC
  Administered 2015-04-26: 1 via NASAL
  Filled 2015-04-26: qty 15

## 2015-04-26 NOTE — ED Notes (Signed)
Pt had heart valve replaced on feb 7th and tonight had onset of nosebleed, pt is on blood thinner, plavix,

## 2015-04-26 NOTE — ED Provider Notes (Signed)
CSN: [id number]     Arrival date & time 04/26/15  2022 History   First MD Initiated Contact with Patient 04/26/15 2243     Chief Complaint  Patient presents with  . Epistaxis   HPI The patient presents to the emergency room for trouble with recurrent epistaxis.  Patient had an aortic valve replacement on February 7. The patient has been taking Plavix since that time. The patient had a nosebleed the other night. He was treated at [facility]. According to the family required several hours of treatment and eventually the bleeding stopped.  This evening however the bleeding restarted. Patient is tried applying pressure without relief. He has been passing large clots of blood from his nose. Past Medical History  Diagnosis Date  . CAD (coronary artery disease)     Multivessel status post CABG 2002  . Essential hypertension   . Depression   . Type 2 diabetes mellitus (HCC)   . PAF (paroxysmal atrial fibrillation) (HCC)     On Sotalol (previously Eliquis - stopped 02/2015)  . Aortic stenosis     Moderate to severe May 2016  . Vitamin D deficiency   . Diabetic nephropathy (HCC)   . BPH (benign prostatic hyperplasia)   . GERD (gastroesophageal reflux disease)   . Chronic systolic heart failure (HCC)     LVEF 45-50% May 2016  . Medication intolerance     Amiodarone - nausea and emesis  . History of fall     Right wrist fracture and facial contusion 02/2015, possibly associated with orthostasis  . Iron deficiency anemia   . Arthritis   . History of blood transfusion    Past Surgical History  Procedure Laterality Date  . Cataract surgery Bilateral     with lens implant  . Coronary artery bypass graft  2002     [facility]  . Adenoidectomy, tonsillectomy and myringotomy with tube placement    . Colonoscopy    . Transcatheter aortic valve replacement, transfemoral N/A 04/15/2015    Procedure: TRANSCATHETER AORTIC VALVE REPLACEMENT, TRANSFEMORAL;  Surgeon: Tonny Bollman, MD;  Location: [facility] OR;  Service: Open Heart Surgery;  Laterality: N/A;  . Tee without cardioversion N/A 04/15/2015    Procedure: TRANSESOPHAGEAL ECHOCARDIOGRAM (TEE);  Surgeon: Tonny Bollman, MD;  Location: [facility] OR;  Service: Open Heart Surgery;  Laterality: N/A;   Family History  Problem Relation Age of Onset  . Heart attack Father   . Hypertension Father   . Diabetes Mellitus II Brother    Social History  Substance Use Topics  . Smoking status: Former Smoker    Types: Cigarettes    Quit date: 03/09/1959  . Smokeless tobacco: Never Used  . Alcohol Use: No    Review of Systems  All other systems reviewed and are negative.     Allergies  Penicillins  Home Medications   Prior to Admission medications   Medication Sig Start Date End Date Taking? Authorizing Provider  acetaminophen (TYLENOL) 325 MG tablet Take 2 tablets (650 mg total) by mouth every 6 (six) hours as needed for mild pain. 04/18/15   Donielle Margaretann Loveless, PA-C  aspirin EC 81 MG tablet Take 81 mg by mouth daily.    Historical Provider, MD  atorvastatin (LIPITOR) 40 MG tablet Take 40 mg by mouth daily.    Historical Provider, MD  clopidogrel (PLAVIX) 75 MG tablet Take 1 tablet (75 mg total) by mouth daily with breakfast. 04/18/15   Ardelle Balls, PA-C  DULoxetine (CYMBALTA) 60 MG capsule Take 120 mg by mouth daily. Taking 60 mg in AM and 60 mg in PM    Historical Provider, MD  furosemide (LASIX) 20 MG tablet Take 20 mg by mouth daily as needed for fluid or edema.    Historical Provider, MD  glimepiride (AMARYL) 4 MG tablet Take 4 mg by mouth daily with breakfast.    Historical Provider, MD  lisinopril (PRINIVIL,ZESTRIL) 10 MG tablet Take 1 tablet (10 mg total) by mouth daily. 04/18/15   Donielle Margaretann Loveless, PA-C  metFORMIN (GLUCOPHAGE) 1000 MG tablet Take 1,000 mg by mouth 2 (two) times daily with a meal.    Historical Provider, MD  omeprazole (PRILOSEC) 20 MG capsule Take 20 mg by mouth daily.    Historical  Provider, MD  sotalol (BETAPACE) 80 MG tablet Take 1 tablet (80 mg total) by mouth 2 (two) times daily. 04/18/15   Donielle Margaretann Loveless, PA-C  tamsulosin (FLOMAX) 0.4 MG CAPS capsule Take 1 capsule (0.4 mg total) by mouth daily after supper. 04/18/15   Donielle Margaretann Loveless, PA-C   BP 166/79 mmHg  Pulse 69  Temp(Src) 97.8 F (36.6 C) (Oral)  Resp 18  Ht  (1.854 m)  Wt 68.04 kg  BMI 19.79 kg/m2  SpO2 96% Physical Exam  Constitutional: He appears well-developed and well-nourished. No distress.  HENT:  Head: Normocephalic and atraumatic.  Right Ear: External ear normal.  Left Ear: External ear normal.  Active bleeding from the left nares, unable to locate the source  Eyes: Conjunctivae are normal. Right eye exhibits no discharge. Left eye exhibits no discharge. No scleral icterus.  Neck: Neck supple. No tracheal deviation present.  Cardiovascular: Normal rate, regular rhythm and intact distal pulses.   Pulmonary/Chest: Effort normal and breath sounds normal. No stridor. No respiratory distress. He has no wheezes. He has no rales.  Abdominal: Soft. Bowel sounds are normal. He exhibits no distension. There is no tenderness. There is no rebound and no guarding.  Musculoskeletal: He exhibits no edema or tenderness.  Neurological: He is alert. He has normal strength. No cranial nerve deficit (no facial droop, extraocular movements intact, no slurred speech) or sensory deficit. He exhibits normal muscle tone. He displays no seizure activity. Coordination normal.  Skin: Skin is warm and dry. No rash noted.  Psychiatric: He has a normal mood and affect.  Nursing note and vitals reviewed.   ED Course  .Epistaxis Management Date/Time: 04/27/2015 12:23 AM Performed by: Linwood Dibbles Authorized by: Linwood Dibbles Consent: The procedure was performed in an emergent situation. Verbal consent obtained. Patient sedated: no Treatment site: left anterior Repair method: nasal balloon Post-procedure  assessment: bleeding decreased Treatment complexity: complex Recurrence: recurrence of recent bleed Patient tolerance: Patient tolerated the procedure well with no immediate complications Comments: Bleeding persisted despite initial packing.  2nd attempt after soaking the rhinostat with TXA.   (including critical care time) Labs Review Labs Reviewed  CBC - Abnormal; Notable for the following:    RBC 4.16 (*)    Hemoglobin 12.1 (*)    HCT 37.7 (*)    All other components within normal limits  PROTIME-INR     MDM   Final diagnoses:  Epistaxis, recurrent    After 2nd packing attempt the patient continues to bleed despite the addition of TXA.  Vitals remain stable. I will consult ENT.     Linwood Dibbles, MD 04/27/15 [phone]

## 2015-04-27 DIAGNOSIS — R040 Epistaxis: Secondary | ICD-10-CM | POA: Diagnosis present

## 2015-04-27 MED ORDER — CEPHALEXIN 500 MG PO CAPS
500.0000 mg | ORAL_CAPSULE | Freq: Three times a day (TID) | ORAL | Status: DC
Start: 2015-04-27 — End: 2015-05-19

## 2015-04-27 NOTE — ED Notes (Signed)
ENT provider at the bedside.

## 2015-04-27 NOTE — Consult Note (Signed)
ENT CONSULT:  Reason for Consult: Severe epistaxis Referring Physician: EDP  Gary Beck is an 82 y.o. male.  HPI: Patient presents to [facility] emergency department for evaluation of significant left epistaxis. The patient underwent recent coronary valve replacement and is currently taking Plavix. Blood pressure has been stable but slightly elevated. The patient has a one-week history of intermittent bleeding, he was seen at his local emergency department several days ago. The patient presents to [facility] with significant left epistaxis, treated by Dr. Lynelle Doctor with nasal packing, continued bleeding.   Past Medical History  Diagnosis Date  . CAD (coronary artery disease)     Multivessel status post CABG 2002  . Essential hypertension   . Depression   . Type 2 diabetes mellitus (HCC)   . PAF (paroxysmal atrial fibrillation) (HCC)     On Sotalol (previously Eliquis - stopped 02/2015)  . Aortic stenosis     Moderate to severe May 2016  . Vitamin D deficiency   . Diabetic nephropathy (HCC)   . BPH (benign prostatic hyperplasia)   . GERD (gastroesophageal reflux disease)   . Chronic systolic heart failure (HCC)     LVEF 45-50% May 2016  . Medication intolerance     Amiodarone - nausea and emesis  . History of fall     Right wrist fracture and facial contusion 02/2015, possibly associated with orthostasis  . Iron deficiency anemia   . Arthritis   . History of blood transfusion     Past Surgical History  Procedure Laterality Date  . Cataract surgery Bilateral     with lens implant  . Coronary artery bypass graft  2002     [facility]  . Adenoidectomy, tonsillectomy and myringotomy with tube placement    . Colonoscopy    . Transcatheter aortic valve replacement, transfemoral N/A 04/15/2015    Procedure: TRANSCATHETER AORTIC VALVE REPLACEMENT, TRANSFEMORAL;  Surgeon: Tonny Bollman, MD;  Location: [facility] OR;  Service: Open Heart Surgery;  Laterality: N/A;  . Tee  without cardioversion N/A 04/15/2015    Procedure: TRANSESOPHAGEAL ECHOCARDIOGRAM (TEE);  Surgeon: Tonny Bollman, MD;  Location: [facility] OR;  Service: Open Heart Surgery;  Laterality: N/A;    Family History  Problem Relation Age of Onset  . Heart attack Father   . Hypertension Father   . Diabetes Mellitus II Brother     Social History:  reports that he quit smoking about 56 years ago. His smoking use included Cigarettes. He has never used smokeless tobacco. He reports that he does not drink alcohol or use illicit drugs.  Allergies:  Allergies  Allergen Reactions  . Penicillins Rash    Has patient had a PCN reaction causing immediate rash, facial/tongue/throat swelling, SOB or lightheadedness with hypotension: Yes Has patient had a PCN reaction causing severe rash involving mucus membranes or skin necrosis: No Has patient had a PCN reaction that required hospitalization No Has patient had a PCN reaction occurring within the last 10 years: No If all of the above answers are "NO", then may proceed with Cephalosporin use.     Medications: I have reviewed the patient's current medications.  Results for orders placed or performed during the hospital encounter of 04/26/15 (from the past 48 hour(s))  CBC     Status: Abnormal   Collection Time: 04/26/15  8:34 PM  Result Value Ref Range   WBC 6.5 4.0 - 10.5 K/uL   RBC 4.16 (L) 4.22 - 5.81 MIL/uL   Hemoglobin 12.1 (L)  13.0 - 17.0 g/dL   HCT 40.9 (L) 81.1 - 91.4 %   MCV 90.6 78.0 - 100.0 fL   MCH 29.1 26.0 - 34.0 pg   MCHC 32.1 30.0 - 36.0 g/dL   RDW 78.2 95.6 - 21.3 %   Platelets 311 150 - 400 K/uL  Protime-INR - (order if Patient is taking Coumadin / Warfarin)     Status: None   Collection Time: 04/26/15  8:34 PM  Result Value Ref Range   Prothrombin Time 13.9 11.6 - 15.2 seconds   INR 1.05 0.00 - 1.49    No results found.  ROS:ROS 12 systems reviewed and negative except as stated in HPI   Blood pressure 166/79, pulse 69,  temperature 97.8 F (36.6 C), temperature source Oral, resp. rate 18, height  (1.854 m), weight 68.04 kg (150 lb), SpO2 96 %.  PHYSICAL EXAM: General appearance - alert, well appearing, and in no distress Nose - severe right nasal septal deviation, active bleeding from the right anterior nasal passageway. Mouth - Clotted blood in the posterior oropharynx, clear without difficulty, no active bleeding.   Procedure: Cautery and packing of complex anterior epistaxis Packing removed from the patient's left nasal passageway. The patient's nose was treated with topical lidocaine/Neo-Synephrine soaked pledgets which were left in place for approximate 10 minutes allowed for vasoconstriction and anesthesia. Examination of the nose showed severely deviated septum with near complete right-sided nasal airway obstruction, anterior arterial bleeding in the anterior left nasal septum. Silver nitrate cautery applied and hemostasis obtained. Patient's nose treated with bacitracin ointment and a 3 cm Merocel nasal pack in the left anterior nasal passageway. No complications.  Studies Reviewed:None   Assessment/Plan: Patient tolerated cautery and packing of the left nasal passageway for complicated arterial bleeding. Recommend epistaxis precautions as outlined below. Packing in place for 5 days, patient prescribed oral antibiotic. Follow-up in our office for packing removal and reevaluation.  1. Limited activity 2. Liquid and soft diet 3. May bathe and shower 4. Saline nasal spray - 4 puffs/nostril every hour while awake 5. Elevate Head of Bed 6. No nose blowing    Gary Beck 04/27/2015, 2:06 AM

## 2015-05-05 ENCOUNTER — Telehealth: Payer: Self-pay | Admitting: Cardiovascular Disease

## 2015-05-05 NOTE — Telephone Encounter (Signed)
We are out of network with patient's insurance.  TAVR and hopsitalization was approved through out of network, however they will not approve office visits or follow up care. I have put in a request for them to approve his post TAVR office visit on 3/13.   Since he has an echo appt scheduled for 3/13 as well his wife was wondering if you could get this set up at [facility] since they are in network?  She was also wondering after his post OP office visit if Dr. Excell Seltzer could recommend someone at [facility] that would be in network for him to follow up with.  I will keep you informed on the status of his auth request for his visit on 3/13. Can you please call the wife about setting up his echo?  I am going to cancel the one he is scheduled for here.  Thank you!  Morrie Sheldon

## 2015-05-06 NOTE — Telephone Encounter (Signed)
Office visit with Dr. Excell Seltzer post op TAVR is approved

## 2015-05-07 NOTE — Telephone Encounter (Signed)
I called the pt's home but no answer at this time.

## 2015-05-13 NOTE — Telephone Encounter (Signed)
I spoke with the pt's wife and the pt participates in Cardiac Rehab at [facility] on Wednesday from 10:30-11:30 AM. I have arranged for the pt to have an echocardiogram performed tomorrow at 2:00 at [facility].  Order faxed to [phone].  Pt's wife aware of appt.

## 2015-05-19 ENCOUNTER — Encounter: Payer: Self-pay | Admitting: Cardiovascular Disease

## 2015-05-19 ENCOUNTER — Other Ambulatory Visit (HOSPITAL_COMMUNITY): Payer: Medicare (Managed Care)

## 2015-05-19 ENCOUNTER — Ambulatory Visit (INDEPENDENT_AMBULATORY_CARE_PROVIDER_SITE_OTHER): Payer: Medicare (Managed Care) | Admitting: Cardiovascular Disease

## 2015-05-19 VITALS — BP 140/70 | HR 60 | Ht 73.0 in | Wt 162.4 lb

## 2015-05-19 DIAGNOSIS — Z952 Presence of prosthetic heart valve: Secondary | ICD-10-CM

## 2015-05-19 DIAGNOSIS — I350 Nonrheumatic aortic (valve) stenosis: Secondary | ICD-10-CM

## 2015-05-19 DIAGNOSIS — Z954 Presence of other heart-valve replacement: Secondary | ICD-10-CM

## 2015-05-19 MED ORDER — LISINOPRIL 10 MG PO TABS: 10.0000 mg | ORAL_TABLET | Freq: Every day | ORAL | Status: AC

## 2015-05-19 MED ORDER — SOTALOL HCL 80 MG PO TABS: 80.0000 mg | ORAL_TABLET | Freq: Two times a day (BID) | ORAL | Status: AC

## 2015-05-19 MED ORDER — CLOPIDOGREL BISULFATE 75 MG PO TABS: 75.0000 mg | ORAL_TABLET | Freq: Every day | ORAL | Status: AC

## 2015-05-19 NOTE — Progress Notes (Signed)
Cardiology Office Note Date:  05/19/2015   ID:  Gary Beck, DOB 04-May-1928, MRN [id number]  PCP:  [name], MD  Cardiologist:  Tonny Bollman, MD    Chief Complaint  Patient presents with  . post op visit    s/p TAVR     History of Present Illness: Gary Beck is a 82 y.o. male who presents for a 30-day post-TAVR visit. The patient was hospitalized last month with acute systolic heart failure, severe LV dysfunction with LVEF 15%, and severe low gradient aortic stenosis. He ultimately was treated with TAVR via a percutaneous transfemoral approach 04/15/2015. His postoperative course was uncomplicated and he presents today for 30 day follow-up evaluation. An echocardiogram has been done in the [facility] system because we are out of network for him. This was performed of 05/14/2015 and demonstrates an LVEF of 35% with grade 3 diastolic dysfunction. There is moderate mitral regurgitation. The patient's mean transaortic gradient is 10 mmHg and there is no paravalvular insufficiency documented.  The patient feels that he is recovering slowly. He remains weak. His breathing is improved. He does have some shortness of breath with moderate level activity. He has no symptoms with his normal daily routine. He denies chest pain or pressure. He has mild leg swelling without orthopnea or PND. He denies lightheadedness, syncope, or falls.   Past Medical History  Diagnosis Date  . CAD (coronary artery disease)     Multivessel status post CABG 2002  . Essential hypertension   . Depression   . Type 2 diabetes mellitus (HCC)   . PAF (paroxysmal atrial fibrillation) (HCC)     On Sotalol (previously Eliquis - stopped 02/2015)  . Aortic stenosis     Moderate to severe May 2016  . Vitamin D deficiency   . Diabetic nephropathy (HCC)   . BPH (benign prostatic hyperplasia)   . GERD (gastroesophageal reflux disease)   . Chronic systolic heart failure (HCC)     LVEF 45-50% May 2016  . Medication  intolerance     Amiodarone - nausea and emesis  . History of fall     Right wrist fracture and facial contusion 02/2015, possibly associated with orthostasis  . Iron deficiency anemia   . Arthritis   . History of blood transfusion     Past Surgical History  Procedure Laterality Date  . Cataract surgery Bilateral     with lens implant  . Coronary artery bypass graft  2002     [facility]  . Adenoidectomy, tonsillectomy and myringotomy with tube placement    . Colonoscopy    . Transcatheter aortic valve replacement, transfemoral N/A 04/15/2015    Procedure: TRANSCATHETER AORTIC VALVE REPLACEMENT, TRANSFEMORAL;  Surgeon: Tonny Bollman, MD;  Location: [facility] OR;  Service: Open Heart Surgery;  Laterality: N/A;  . Tee without cardioversion N/A 04/15/2015    Procedure: TRANSESOPHAGEAL ECHOCARDIOGRAM (TEE);  Surgeon: Tonny Bollman, MD;  Location: [facility] OR;  Service: Open Heart Surgery;  Laterality: N/A;    Current Outpatient Prescriptions  Medication Sig Dispense Refill  . acetaminophen (TYLENOL) 325 MG tablet Take 2 tablets (650 mg total) by mouth every 6 (six) hours as needed for mild pain.    Marland Kitchen aspirin EC 81 MG tablet Take 81 mg by mouth daily.    Marland Kitchen atorvastatin (LIPITOR) 40 MG tablet Take 40 mg by mouth daily.    . clopidogrel (PLAVIX) 75 MG tablet Take 1 tablet (75 mg total) by mouth daily with breakfast. 30 tablet 1  .  DULoxetine (CYMBALTA) 60 MG capsule Take 120 mg by mouth daily. Taking 60 mg in AM and 60 mg in PM    . furosemide (LASIX) 20 MG tablet Take 20 mg by mouth daily as needed for fluid or edema.    Marland Kitchen. glimepiride (AMARYL) 4 MG tablet Take 4 mg by mouth daily with breakfast.    . lisinopril (PRINIVIL,ZESTRIL) 10 MG tablet Take 1 tablet (10 mg total) by mouth daily. 30 tablet 1  . metFORMIN (GLUCOPHAGE) 1000 MG tablet Take 1,000 mg by mouth 2 (two) times daily with a meal.    . nitroGLYCERIN (NITROSTAT) 0.4 MG SL tablet Place 0.4 mg under the tongue as needed. Every  5 minutes as needed for chest pain. (MAX of 3 doses)    . omeprazole (PRILOSEC) 20 MG capsule Take 20 mg by mouth daily.    . ondansetron (ZOFRAN) 4 MG tablet Take 4 mg by mouth 3 (three) times daily as needed. nausea    . sotalol (BETAPACE) 80 MG tablet Take 1 tablet (80 mg total) by mouth 2 (two) times daily. 60 tablet 1  . tamsulosin (FLOMAX) 0.4 MG CAPS capsule Take 1 capsule (0.4 mg total) by mouth daily after supper. 30 capsule 1  . Vitamin D, Ergocalciferol, (DRISDOL) 50000 units CAPS capsule Take 1 capsule by mouth every 30 (thirty) days.     No current facility-administered medications for this visit.    Allergies:   Penicillins   Social History:  The patient  reports that he quit smoking about 56 years ago. His smoking use included Cigarettes. He has never used smokeless tobacco. He reports that he does not drink alcohol or use illicit drugs.   Family History:  The patient's family history includes Diabetes Mellitus II in his brother; Heart attack in his father; Hypertension in his father.    ROS:  Please see the history of present illness.  Otherwise, review of systems is positive for Fatigue, generalized weakness.  All other systems are reviewed and negative.    PHYSICAL EXAM: VS:  BP 140/70 mmHg  Pulse 60  Ht 6\' 1"  (1.854 m)  Wt 162 lb 6.4 oz (73.664 kg)  BMI 21.43 kg/m2 , BMI Body mass index is 21.43 kg/(m^2). GEN: Well nourished, well developed, pleasant elderly male in no acute distress HEENT: normal Neck: no JVD, no masses. No carotid bruits Cardiac: RRR grade 2/6 systolic ejection murmur at the right upper sternal border        Respiratory:  clear to auscultation bilaterally, normal work of breathing GI: soft, nontender, nondistended, + BS MS: no deformity or atrophy Ext: 1+ pretibial edema, pedal pulses 2+= bilaterally Skin: warm and dry, no rash Neuro:  Strength and sensation are intact Psych: euthymic mood, full affect  EKG:  EKG is not ordered  today.  Recent Labs: 04/15/2015: ALT 49 04/16/2015: Magnesium 2.3 04/17/2015: BUN 11; Creatinine, Ser 0.99; Potassium 4.0; Sodium 134* 04/26/2015: Hemoglobin 12.1*; Platelets 311   Lipid Panel     Component Value Date/Time   CHOL 142 04/08/2015 0231   TRIG 68 04/08/2015 0231   HDL 46 04/08/2015 0231   CHOLHDL 3.1 04/08/2015 0231   VLDL 14 04/08/2015 0231   LDLCALC 82 04/08/2015 0231      Wt Readings from Last 3 Encounters:  05/19/15 162 lb 6.4 oz (73.664 kg)  04/26/15 150 lb (68.04 kg)  04/18/15 159 lb 1.6 oz (72.167 kg)     Cardiac Studies Reviewed: 2D Echo 05/14/2015: Echocardiogram W Colorflow Spectral  Doppler3/10/2015  [facility]  Echocardiogram W Colorflow Spectral Doppler3/10/2015  [facility]  Component Name Value Range  LV Diastolic Diameter PLAX 6.42 cm  LV Systolic Diameter PLAX 5.21 cm  IVS Diastolic Thickness 1.15 cm  LVPW Diastolic Thickness 1.06 cm  LV SYSTOLIC VOLUME MOD 4C 107 cm3  LV DIASTOLIC VOLUME MOD 4C 165 cm3  LV EJECTION FRACTION MOD 4C 35 %  LVOT Peak Velocity 0.01 m/s  LVOT Velocity Time Integral 15 cm  LVOT Diameter 1.9 cm  Mitral E to A Ratio 2.50   LV STROKE VOLUME MOD 4C 58 cm3  LVOT Peak Gradient 1.00 mmHg   Mitral E Point Velocity 0.01 m/s  Mitral A Point Velocity 0 m/s  LA Systolic Diameter LX 5.1 cm  LA Area 4C View 34.8 cm2  AV Peak Velocity 2.3 m/s  AV Mean Gradient 10.00 mmHg   AV Peak Gradient 21.00   AV Velocity Time Integral 48.3 cm  Aortic Root Diameter 3.2 cm  TR Peak Velocity 3.58 m/s  PV Peak Velocity 0.9 m/s  Pulmonary Artery Systolic Pressure 51.00 mmHg   PV peak gradient 3.00 mmHg    Result Narrative   Left ventricular hypertrophy - mild  Severely decreased left ventricular systolic function, ejection fraction  35%  Diastolic dysfunction - grade III (severely elevated filling pressures)  Mitral annular calcification  Mitral regurgitation - moderate  Dilated left atrium - moderate  Bioprosthetic  aortic valve  Normal right ventricular systolic function  Tricuspid regurgitation - moderate  Elevated pulmonary artery systolic pressure - moderate  Dilated right atrium - mild  Pleural effusion     ASSESSMENT AND PLAN: 1.  Severe low gradient aortic stenosis status post TAVR: The patient is doing well with NYHA functional class II symptoms. His LVEF is improved from 15% up to 35% now. He's tolerating an ACE inhibitor and beta blocker. He is on dual antiplatelet therapy with aspirin and Plavix. He will follow-up in [place] as we are out of his network. His primary cardiologist is Dr. [name]. He should continue Plavix for a total of 6 months. His prescription is updated today.  2. Chronic systolic heart failure, NYHA 2: Patient on appropriate medical therapy. Advised to use Lasix as needed for leg swelling.  3. Paroxysmal atrial fibrillation: Appears to be maintaining sinus rhythm on sotalol. He was taken off of chronic oral anticoagulation last year after a fall. He is currently on dual antiplatelet therapy with aspirin and Plavix. He will stop Plavix at 6 months and I will defer decisions on oral anticoagulation to his primary cardiologist/PCP.  Current medicines are reviewed with the patient today.  The patient does not have concerns regarding medicines.  Labs/ tests ordered today include:  No orders of the defined types were placed in this encounter.    Disposition:   FU with Dr Mikey Bussing (scheduled next week) and Dr [name] (recommend 3 months)  Signed, Tonny Bollman, MD  05/19/2015 10:58 AM    [facility] [address] [place], [place], [place]  [address] Phone: [phone]; Fax: [phone]

## 2015-05-19 NOTE — Patient Instructions (Addendum)
Medication Instructions:  Your physician recommends that you continue on your current medications as directed. Please refer to the Current Medication list given to you today.  Labwork: No new orders.   Testing/Procedures: Your physician has requested that you have an echocardiogram in 1 YEAR (will arrange [facility]). Echocardiography is a painless test that uses sound waves to create images of your heart. It provides your doctor with information about the size and shape of your heart and how well your heart's chambers and valves are working. This procedure takes approximately one hour. There are no restrictions for this procedure.  Follow-Up: Your physician recommends that you arrange follow-up appointment with your primary cardiologist Dr Lodema HongSimpson in 3 MONTHS.   Your physician wants you to follow-up in: 1 YEAR with Dr Excell Seltzerooper. You will receive a reminder letter in the mail two months in advance. If you don't receive a letter, please call our office to schedule the follow-up appointment.   Any Other Special Instructions Will Be Listed Below (If Applicable).  Your physician discussed the importance of taking an antibiotic prior to any dental, gastrointestinal, genitourinary procedures to prevent damage to the heart valves from infection.        If you need a refill on your cardiac medications before your next appointment, please call your pharmacy.

## 2015-05-21 ENCOUNTER — Encounter: Payer: Self-pay | Admitting: Student

## 2015-05-21 DIAGNOSIS — E43 Unspecified severe protein-calorie malnutrition: Secondary | ICD-10-CM | POA: Insufficient documentation

## 2015-06-09 ENCOUNTER — Encounter: Payer: Self-pay | Admitting: Cardiovascular Disease

## 2015-06-19 ENCOUNTER — Other Ambulatory Visit: Payer: Self-pay | Admitting: Physician Assistant

## 2016-01-07 DEATH — deceased
